# Patient Record
Sex: Female | Born: 1960 | Race: White | Hispanic: No | Marital: Married | Smoking: Never smoker
Health system: Southern US, Community
[De-identification: ages and names within clinical notes are randomized; demographics above are authoritative.]

## PROBLEM LIST (undated history)

## (undated) DIAGNOSIS — R002 Palpitations: Secondary | ICD-10-CM

## (undated) DIAGNOSIS — E209 Hypoparathyroidism, unspecified: Secondary | ICD-10-CM

## (undated) DIAGNOSIS — E785 Hyperlipidemia, unspecified: Secondary | ICD-10-CM

## (undated) DIAGNOSIS — C73 Malignant neoplasm of thyroid gland: Secondary | ICD-10-CM

## (undated) DIAGNOSIS — E042 Nontoxic multinodular goiter: Secondary | ICD-10-CM

## (undated) HISTORY — DX: Palpitations: R00.2

## (undated) HISTORY — PX: THYROID SURGERY: SHX805

## (undated) HISTORY — DX: Hyperlipidemia, unspecified: E78.5

## (undated) HISTORY — DX: Nontoxic multinodular goiter: E04.2

## (undated) HISTORY — PX: OTHER SURGICAL HISTORY: SHX169

## (undated) HISTORY — DX: Malignant neoplasm of thyroid gland: C73

## (undated) HISTORY — DX: Hypoparathyroidism, unspecified: E20.9

---

## 2000-10-21 ENCOUNTER — Other Ambulatory Visit: Admission: RE | Admit: 2000-10-21 | Discharge: 2000-10-21 | Payer: Self-pay | Admitting: *Deleted

## 2001-10-28 ENCOUNTER — Other Ambulatory Visit: Admission: RE | Admit: 2001-10-28 | Discharge: 2001-10-28 | Payer: Self-pay | Admitting: *Deleted

## 2002-12-13 ENCOUNTER — Other Ambulatory Visit: Admission: RE | Admit: 2002-12-13 | Discharge: 2002-12-13 | Payer: Self-pay | Admitting: *Deleted

## 2004-01-23 ENCOUNTER — Other Ambulatory Visit: Admission: RE | Admit: 2004-01-23 | Discharge: 2004-01-23 | Payer: Self-pay | Admitting: *Deleted

## 2005-02-05 ENCOUNTER — Other Ambulatory Visit: Admission: RE | Admit: 2005-02-05 | Discharge: 2005-02-05 | Payer: Self-pay | Admitting: *Deleted

## 2006-02-11 ENCOUNTER — Other Ambulatory Visit: Admission: RE | Admit: 2006-02-11 | Discharge: 2006-02-11 | Payer: Self-pay | Admitting: *Deleted

## 2006-04-07 ENCOUNTER — Encounter: Admission: RE | Admit: 2006-04-07 | Discharge: 2006-04-07 | Payer: Self-pay | Admitting: Surgery

## 2006-04-15 ENCOUNTER — Encounter: Admission: RE | Admit: 2006-04-15 | Discharge: 2006-04-15 | Payer: Self-pay | Admitting: Surgery

## 2006-10-27 ENCOUNTER — Encounter: Admission: RE | Admit: 2006-10-27 | Discharge: 2006-10-27 | Payer: Self-pay | Admitting: Surgery

## 2007-03-09 ENCOUNTER — Other Ambulatory Visit: Admission: RE | Admit: 2007-03-09 | Discharge: 2007-03-09 | Payer: Self-pay | Admitting: Family Medicine

## 2007-06-24 ENCOUNTER — Encounter: Admission: RE | Admit: 2007-06-24 | Discharge: 2007-06-24 | Payer: Self-pay | Admitting: Family Medicine

## 2008-03-09 ENCOUNTER — Other Ambulatory Visit: Admission: RE | Admit: 2008-03-09 | Discharge: 2008-03-09 | Payer: Self-pay | Admitting: Family Medicine

## 2008-07-04 ENCOUNTER — Encounter: Admission: RE | Admit: 2008-07-04 | Discharge: 2008-07-04 | Payer: Self-pay | Admitting: Family Medicine

## 2009-01-16 ENCOUNTER — Other Ambulatory Visit: Admission: RE | Admit: 2009-01-16 | Discharge: 2009-01-16 | Payer: Self-pay | Admitting: Interventional Radiology

## 2009-01-16 ENCOUNTER — Encounter: Admission: RE | Admit: 2009-01-16 | Discharge: 2009-01-16 | Payer: Self-pay | Admitting: Surgery

## 2009-03-08 ENCOUNTER — Ambulatory Visit (HOSPITAL_COMMUNITY): Admission: RE | Admit: 2009-03-08 | Discharge: 2009-03-09 | Payer: Self-pay | Admitting: Surgery

## 2009-03-08 ENCOUNTER — Encounter (INDEPENDENT_AMBULATORY_CARE_PROVIDER_SITE_OTHER): Payer: Self-pay | Admitting: Surgery

## 2009-03-29 ENCOUNTER — Other Ambulatory Visit: Admission: RE | Admit: 2009-03-29 | Discharge: 2009-03-29 | Payer: Self-pay | Admitting: Family Medicine

## 2009-04-30 ENCOUNTER — Encounter (HOSPITAL_COMMUNITY): Admission: RE | Admit: 2009-04-30 | Discharge: 2009-07-25 | Payer: Self-pay | Admitting: Internal Medicine

## 2009-07-31 ENCOUNTER — Encounter: Admission: RE | Admit: 2009-07-31 | Discharge: 2009-07-31 | Payer: Self-pay | Admitting: Family Medicine

## 2009-09-18 ENCOUNTER — Encounter: Admission: RE | Admit: 2009-09-18 | Discharge: 2009-09-18 | Payer: Self-pay | Admitting: Internal Medicine

## 2009-09-25 ENCOUNTER — Encounter: Admission: RE | Admit: 2009-09-25 | Discharge: 2009-09-25 | Payer: Self-pay | Admitting: Internal Medicine

## 2009-09-25 ENCOUNTER — Other Ambulatory Visit: Admission: RE | Admit: 2009-09-25 | Discharge: 2009-09-25 | Payer: Self-pay | Admitting: Interventional Radiology

## 2010-03-17 ENCOUNTER — Encounter: Payer: Self-pay | Admitting: Internal Medicine

## 2010-03-17 ENCOUNTER — Encounter: Payer: Self-pay | Admitting: Family Medicine

## 2010-03-17 ENCOUNTER — Encounter: Payer: Self-pay | Admitting: Surgery

## 2010-03-18 ENCOUNTER — Encounter: Payer: Self-pay | Admitting: Surgery

## 2010-03-31 ENCOUNTER — Other Ambulatory Visit (HOSPITAL_COMMUNITY): Payer: Self-pay | Admitting: Internal Medicine

## 2010-03-31 DIAGNOSIS — C73 Malignant neoplasm of thyroid gland: Secondary | ICD-10-CM

## 2010-04-08 ENCOUNTER — Other Ambulatory Visit: Payer: Self-pay | Admitting: Physician Assistant

## 2010-04-08 ENCOUNTER — Other Ambulatory Visit (HOSPITAL_COMMUNITY)
Admission: RE | Admit: 2010-04-08 | Discharge: 2010-04-08 | Disposition: A | Discharge status: Home / Self Care | Payer: Managed Care, Other (non HMO) | Source: Ambulatory Visit | Attending: Family Medicine | Admitting: Family Medicine

## 2010-04-08 DIAGNOSIS — Z01419 Encounter for gynecological examination (general) (routine) without abnormal findings: Secondary | ICD-10-CM | POA: Insufficient documentation

## 2010-04-15 ENCOUNTER — Ambulatory Visit (HOSPITAL_COMMUNITY)
Admission: RE | Admit: 2010-04-15 | Discharge: 2010-04-15 | Disposition: A | Discharge status: Home / Self Care | Payer: Managed Care, Other (non HMO) | Source: Ambulatory Visit | Attending: Internal Medicine | Admitting: Internal Medicine

## 2010-04-15 DIAGNOSIS — C73 Malignant neoplasm of thyroid gland: Secondary | ICD-10-CM | POA: Insufficient documentation

## 2010-04-16 ENCOUNTER — Ambulatory Visit (HOSPITAL_COMMUNITY)
Admission: RE | Admit: 2010-04-16 | Discharge: 2010-04-16 | Disposition: A | Discharge status: Home / Self Care | Payer: Managed Care, Other (non HMO) | Source: Ambulatory Visit | Attending: Internal Medicine | Admitting: Internal Medicine

## 2010-04-17 ENCOUNTER — Ambulatory Visit (HOSPITAL_COMMUNITY)
Admission: RE | Admit: 2010-04-17 | Discharge: 2010-04-17 | Disposition: A | Discharge status: Home / Self Care | Payer: Managed Care, Other (non HMO) | Source: Ambulatory Visit | Attending: Internal Medicine | Admitting: Internal Medicine

## 2010-04-17 DIAGNOSIS — Z01419 Encounter for gynecological examination (general) (routine) without abnormal findings: Secondary | ICD-10-CM | POA: Insufficient documentation

## 2010-04-19 ENCOUNTER — Ambulatory Visit (HOSPITAL_COMMUNITY)
Admission: RE | Admit: 2010-04-19 | Discharge: 2010-04-19 | Disposition: A | Discharge status: Home / Self Care | Payer: Managed Care, Other (non HMO) | Source: Ambulatory Visit | Attending: Internal Medicine | Admitting: Internal Medicine

## 2010-04-19 DIAGNOSIS — C73 Malignant neoplasm of thyroid gland: Secondary | ICD-10-CM | POA: Insufficient documentation

## 2010-04-19 MED ORDER — SODIUM IODIDE I 131 CAPSULE: 4.0000 | Freq: Once | INTRAVENOUS | Status: AC | PRN

## 2010-04-22 LAB — THYROGLOBULIN ANTIBODY: Thyroglobulin Ab: <20 U/mL (ref <40.0)

## 2010-04-22 LAB — THYROGLOBULIN LEVEL: Thyroglobulin: <0.2 ng/mL (ref 0.0–55.0)

## 2010-05-12 LAB — BASIC METABOLIC PANEL
BUN: 11 mg/dL (ref 6–23)
Chloride: 105 mEq/L (ref 96–112)
Creatinine, Ser: 0.58 mg/dL (ref 0.4–1.2)
GFR calc Af Amer: >60 mL/min (ref >60)
Glucose, Bld: 88 mg/dL (ref 70–99)
Potassium: 4.2 mEq/L (ref 3.5–5.1)
Sodium: 139 mEq/L (ref 135–145)

## 2010-05-12 LAB — CBC
HCT: 37.4 % (ref 36.0–46.0)
MCV: 97 fL (ref 78.0–100.0)
Platelets: 303 10*3/uL (ref 150–400)
RBC: 3.86 MIL/uL — ABNORMAL LOW (ref 3.87–5.11)
WBC: 4.6 10*3/uL (ref 4.0–10.5)

## 2010-05-12 LAB — PROTIME-INR
INR: 0.96 (ref 0.00–1.49)
Prothrombin Time: 12.7 seconds (ref 11.6–15.2)

## 2010-05-12 LAB — URINE MICROSCOPIC-ADD ON

## 2010-05-12 LAB — DIFFERENTIAL
Eosinophils Absolute: 0 10*3/uL (ref 0.0–0.7)
Lymphocytes Relative: 32 % (ref 12–46)
Lymphs Abs: 1.5 10*3/uL (ref 0.7–4.0)
Monocytes Relative: 10 % (ref 3–12)
Neutrophils Relative %: 56 % (ref 43–77)

## 2010-05-12 LAB — URINALYSIS, ROUTINE W REFLEX MICROSCOPIC
Leukocytes, UA: NEGATIVE
Nitrite: NEGATIVE
Specific Gravity, Urine: 1.025 (ref 1.005–1.030)
Urobilinogen, UA: 0.2 mg/dL (ref 0.0–1.0)
pH: 6 (ref 5.0–8.0)

## 2010-05-12 LAB — CALCIUM: Calcium: 8.1 mg/dL — ABNORMAL LOW (ref 8.4–10.5)

## 2010-06-27 ENCOUNTER — Other Ambulatory Visit: Payer: Self-pay | Admitting: Advanced Practice Midwife

## 2010-06-27 DIAGNOSIS — Z1231 Encounter for screening mammogram for malignant neoplasm of breast: Secondary | ICD-10-CM

## 2010-07-01 ENCOUNTER — Encounter (INDEPENDENT_AMBULATORY_CARE_PROVIDER_SITE_OTHER): Payer: Self-pay | Admitting: Surgery

## 2010-07-01 DIAGNOSIS — E079 Disorder of thyroid, unspecified: Secondary | ICD-10-CM | POA: Insufficient documentation

## 2010-07-01 DIAGNOSIS — D229 Melanocytic nevi, unspecified: Secondary | ICD-10-CM | POA: Insufficient documentation

## 2010-07-01 DIAGNOSIS — R490 Dysphonia: Secondary | ICD-10-CM | POA: Insufficient documentation

## 2010-08-05 ENCOUNTER — Ambulatory Visit
Admission: RE | Admit: 2010-08-05 | Discharge: 2010-08-05 | Disposition: A | Discharge status: Home / Self Care | Payer: Managed Care, Other (non HMO) | Source: Ambulatory Visit | Attending: Advanced Practice Midwife | Admitting: Advanced Practice Midwife

## 2010-08-05 DIAGNOSIS — Z1231 Encounter for screening mammogram for malignant neoplasm of breast: Secondary | ICD-10-CM

## 2011-03-16 IMAGING — US US BIOPSY
1 series · 13 of 16 positions shown · non-contrast
Comparison: None

CLINICAL DATA: Bilateral thyroid nodules being followed by general
surgery.  Request for fine needle aspiration of both nodules due to
increase in size and calcifications.

ULTRASOUND-GUIDED NEEDLE ASPIRATE BIOPSY,  LEFT AND RIGHT LOBE OF
THE THYROID
The above procedure was discussed with the patient and written
informed consent was obtained.

[Series 1: us biopsy · 0.05mm/px · 16 acquisitions, 13 frames shown]
[im 1/16]
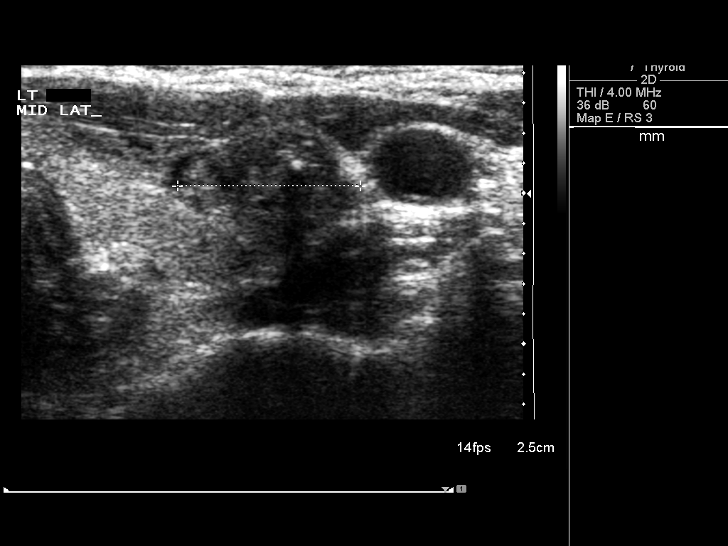
[im 2/16]
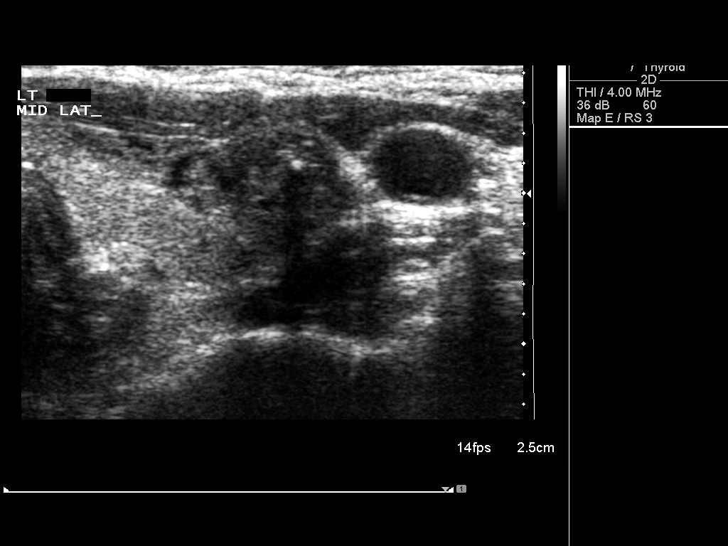
[im 4/16]
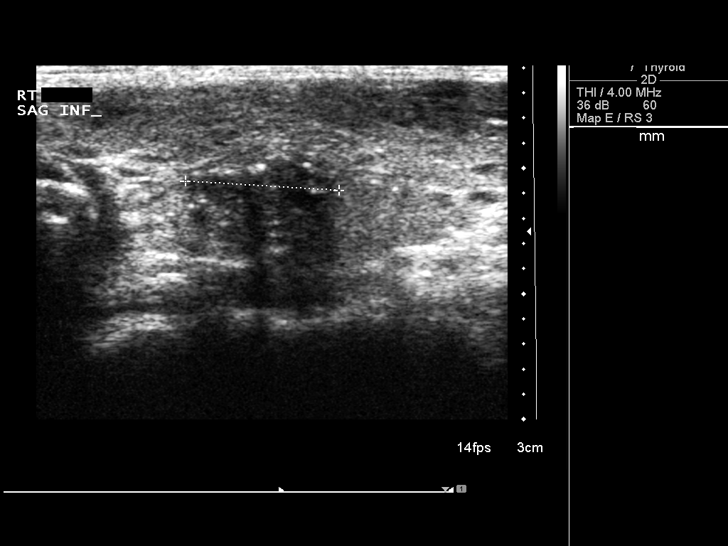
[im 5/16]
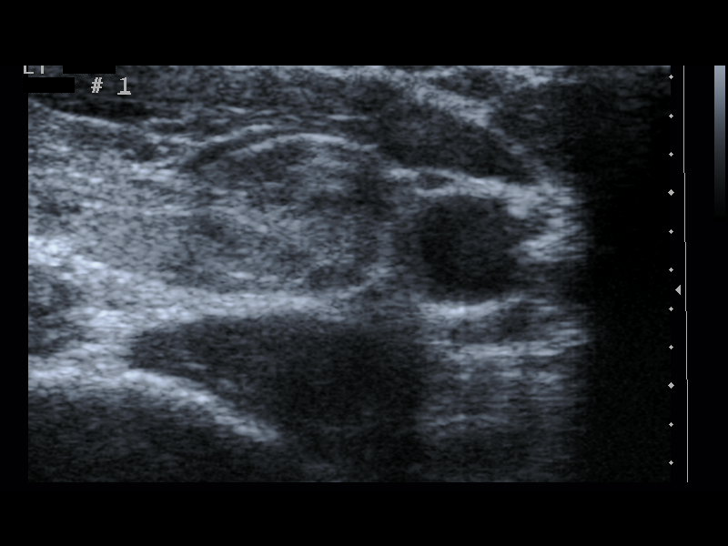
[im 6/16]
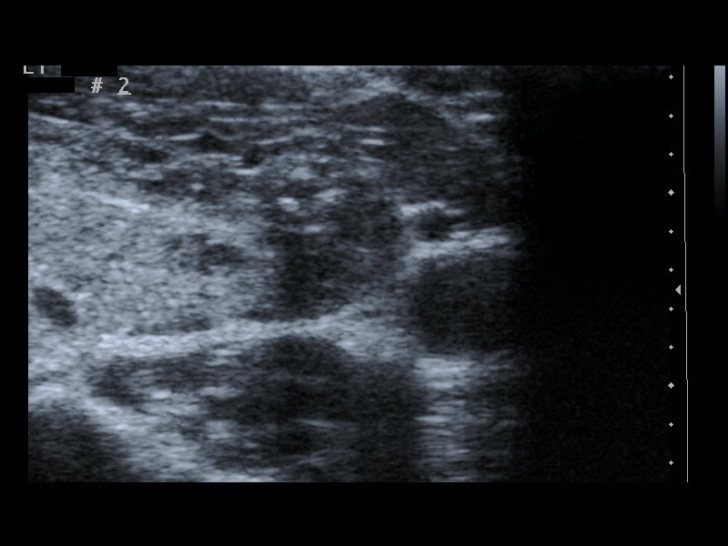
[im 7/16]
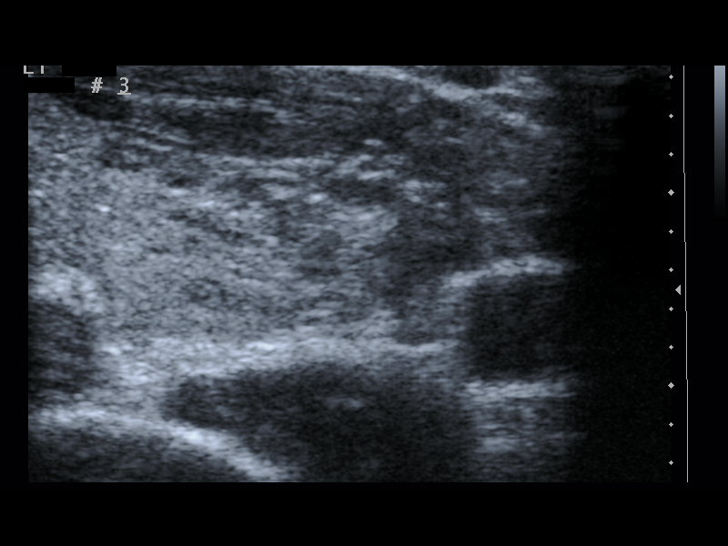
[im 9/16]
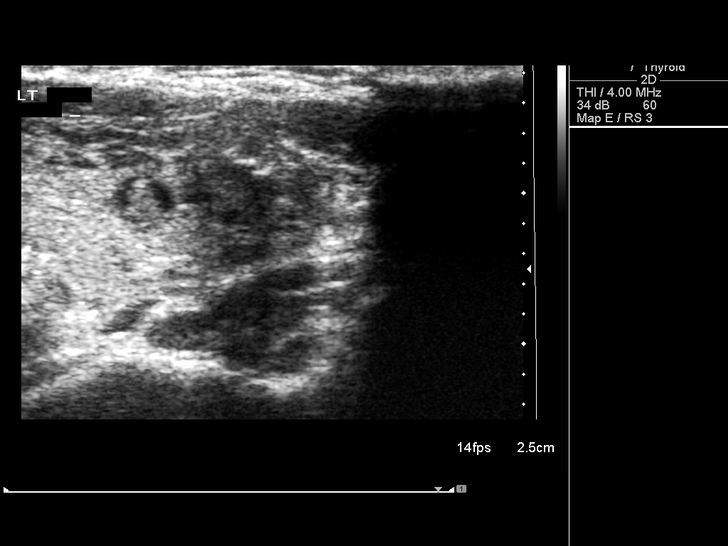
[im 10/16]
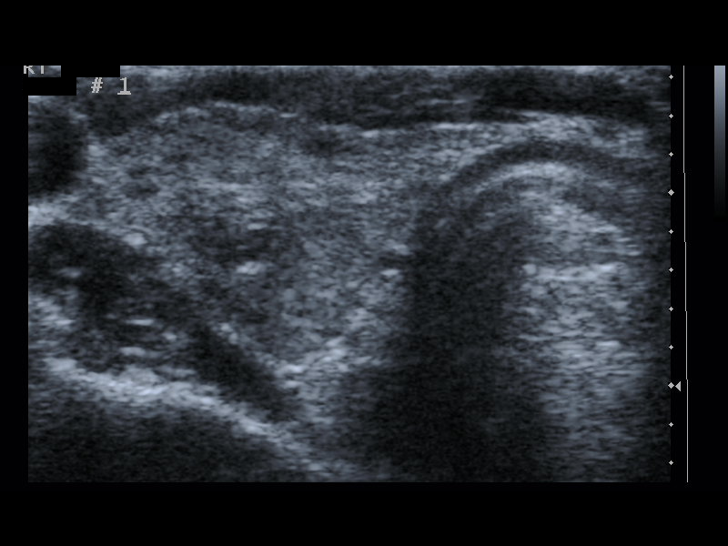
[im 11/16]
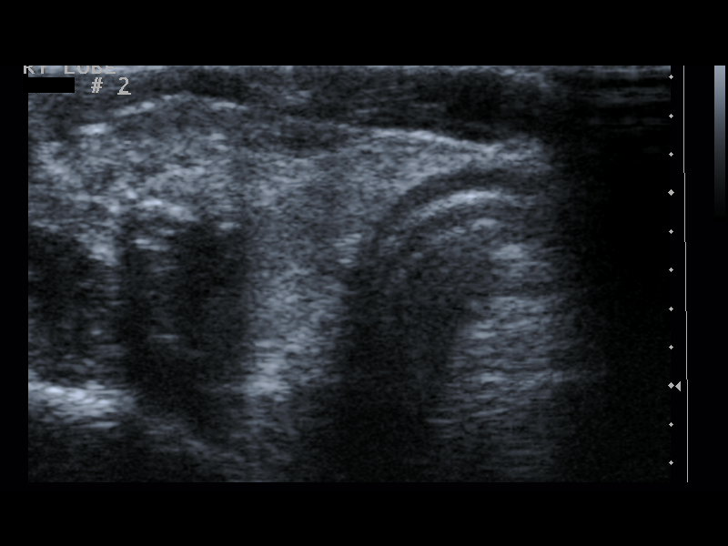
[im 12/16]
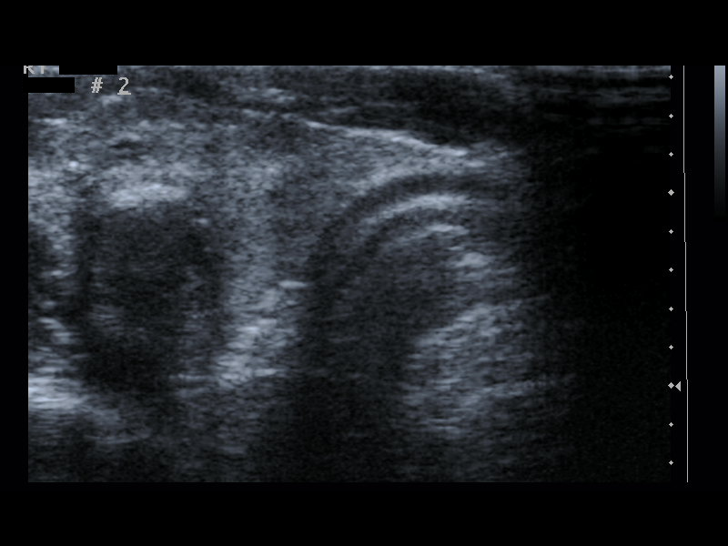
[im 13/16]
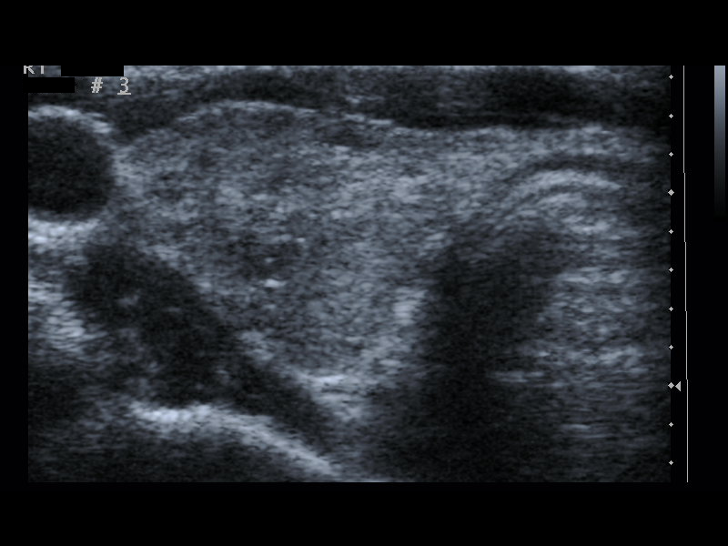
[im 15/16]
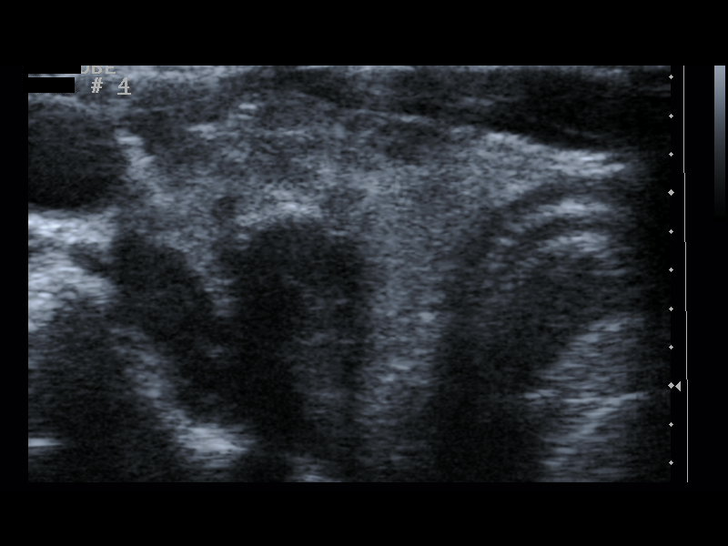
[im 16/16]
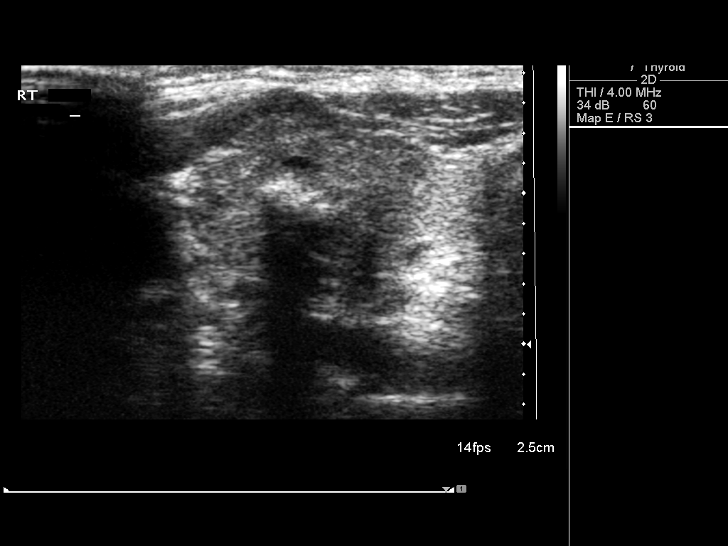

[13 of 16 positions shown; findings below may reference images not displayed]

FINDINGS: Ultrasound was performed to localize and mark an adequate
site for the biopsy.  The patient was then prepped and draped in a
normal sterile fashion.  Local anesthesia was provided with 1%
lidocaine.  Using direct ultrasound guidance, three passes were
made using 25 gauge needles into the nodule within the left lobe of
the thyroid.  Ultrasound was used to confirm needle placements on
all occasions.

Local anesthesia was provided with 1% lidocaine over the right
thyroid gland.  Using direct ultrasound guidance, four passes were
made using 25 gauge needles into the nodule within the right lobe
of the thyroid.  Ultrasound was used to confirm needle placement on
all occasions.

Specimens were sent to Pathology for analysis.  Post procedural
imaging demonstrated no hematoma or immediate complication.  The
patient tolerated the procedure well.
IMPRESSION: Successful fine needle aspirate of right and left thyroid nodules
as described above.

Read by: Brousset, Gilberto Pedro.-MANCE

## 2011-05-05 ENCOUNTER — Encounter (INDEPENDENT_AMBULATORY_CARE_PROVIDER_SITE_OTHER): Payer: Self-pay | Admitting: Surgery

## 2011-05-05 ENCOUNTER — Ambulatory Visit (INDEPENDENT_AMBULATORY_CARE_PROVIDER_SITE_OTHER): Payer: Managed Care, Other (non HMO) | Admitting: Surgery

## 2011-05-05 VITALS — BP 112/72 | HR 80 | Temp 97.0°F | Resp 16 | Ht 66.0 in | Wt 124.0 lb

## 2011-05-05 DIAGNOSIS — Z85850 Personal history of malignant neoplasm of thyroid: Secondary | ICD-10-CM | POA: Insufficient documentation

## 2011-05-05 NOTE — Progress Notes (Signed)
Visit Diagnoses: 1. Hx of thyroid cancer     HISTORY: Patient is a 51 year old white female well-known to my surgical practice. She is now over 2 years out from total thyroidectomy with limited lymph node dissection for multifocal papillary thyroid carcinoma. She was treated with radioactive iodine ablation. She returns today for her final postoperative visit. She continues to be followed closely by her endocrinologist.  Recent laboratory studies in January 2013 show a thyroglobulin level which is undetectable. Patient is taking Synthroid 175 mcg daily and had a TSH level of 0.19. Calcium level was normal at 8.6.  PERTINENT REVIEW OF SYSTEMS: Denies masses. Denies tenderness. Voice quality normal except when fatigued.  No tremors. No palpitations.  EXAM: HEENT: normocephalic; pupils equal and reactive; sclerae clear; dentition good; mucous membranes moist NECK:  On palpation no nodular densities in the thyroid bed or around the airway. No tenderness;  Incision with good cosmetic results; symmetric on extension; no palpable anterior or posterior cervical lymphadenopathy; no supraclavicular masses; no tenderness CHEST: clear to auscultation bilaterally without rales, rhonchi, or wheezes CARDIAC: regular rate and rhythm without significant murmur; peripheral pulses are full EXT:  non-tender without edema; no deformity NEURO: no gross focal deficits; no sign of tremor   IMPRESSION: Personal history of papillary thyroid carcinoma, no evidence of recurrent disease  PLAN: The patient will continue followup with Dr. Talmage Coin from endocrinology. No further studies are required at this time. Patient will return for surgical followup as needed.  Velora Heckler, MD, FACS General & Endocrine Surgery [facility]

## 2011-07-03 ENCOUNTER — Other Ambulatory Visit: Payer: Self-pay | Admitting: Family Medicine

## 2011-07-03 DIAGNOSIS — Z1231 Encounter for screening mammogram for malignant neoplasm of breast: Secondary | ICD-10-CM

## 2011-08-11 ENCOUNTER — Ambulatory Visit
Admission: RE | Admit: 2011-08-11 | Discharge: 2011-08-11 | Disposition: A | Discharge status: Home / Self Care | Payer: Managed Care, Other (non HMO) | Source: Ambulatory Visit | Attending: Family Medicine | Admitting: Family Medicine

## 2011-08-11 DIAGNOSIS — Z1231 Encounter for screening mammogram for malignant neoplasm of breast: Secondary | ICD-10-CM

## 2011-09-30 ENCOUNTER — Other Ambulatory Visit: Payer: Self-pay | Admitting: Internal Medicine

## 2011-09-30 DIAGNOSIS — C73 Malignant neoplasm of thyroid gland: Secondary | ICD-10-CM

## 2011-10-13 ENCOUNTER — Other Ambulatory Visit: Payer: Managed Care, Other (non HMO)

## 2011-10-14 ENCOUNTER — Ambulatory Visit
Admission: RE | Admit: 2011-10-14 | Discharge: 2011-10-14 | Disposition: A | Discharge status: Home / Self Care | Payer: Managed Care, Other (non HMO) | Source: Ambulatory Visit | Attending: Internal Medicine | Admitting: Internal Medicine

## 2011-10-14 DIAGNOSIS — C73 Malignant neoplasm of thyroid gland: Secondary | ICD-10-CM

## 2012-07-05 ENCOUNTER — Other Ambulatory Visit: Payer: Self-pay

## 2012-07-05 DIAGNOSIS — Z1231 Encounter for screening mammogram for malignant neoplasm of breast: Secondary | ICD-10-CM

## 2012-07-09 MED ORDER — HYDROMORPHONE HCL PF 1 MG/ML IJ SOLN
INTRAMUSCULAR | Status: AC
  Filled 2012-07-09: qty 1

## 2012-07-14 ENCOUNTER — Encounter (HOSPITAL_COMMUNITY): Payer: Self-pay | Admitting: Pharmacist

## 2012-07-20 ENCOUNTER — Other Ambulatory Visit: Payer: Self-pay | Admitting: Obstetrics and Gynecology

## 2012-07-22 ENCOUNTER — Encounter (HOSPITAL_COMMUNITY)
Admission: RE | Admit: 2012-07-22 | Discharge: 2012-07-22 | Disposition: A | Discharge status: Home / Self Care | Payer: BC Managed Care – PPO | Source: Ambulatory Visit | Attending: Obstetrics and Gynecology | Admitting: Obstetrics and Gynecology

## 2012-07-22 ENCOUNTER — Encounter (HOSPITAL_COMMUNITY): Payer: Self-pay

## 2012-07-22 DIAGNOSIS — Z01818 Encounter for other preprocedural examination: Secondary | ICD-10-CM | POA: Insufficient documentation

## 2012-07-22 DIAGNOSIS — Z01812 Encounter for preprocedural laboratory examination: Secondary | ICD-10-CM | POA: Insufficient documentation

## 2012-07-22 LAB — CBC
HCT: 36.4 % (ref 36.0–46.0)
MCH: 32.3 pg (ref 26.0–34.0)
MCHC: 33.2 g/dL (ref 30.0–36.0)
MCV: 97.1 fL (ref 78.0–100.0)
Platelets: 290 10*3/uL (ref 150–400)
RDW: 13 % (ref 11.5–15.5)
WBC: 6.8 10*3/uL (ref 4.0–10.5)

## 2012-07-22 LAB — BASIC METABOLIC PANEL
BUN: 13 mg/dL (ref 6–23)
CO2: 25 mEq/L (ref 19–32)
Calcium: 9.1 mg/dL (ref 8.4–10.5)
Creatinine, Ser: 0.59 mg/dL (ref 0.50–1.10)
Glucose, Bld: 137 mg/dL — ABNORMAL HIGH (ref 70–99)

## 2012-07-22 NOTE — Patient Instructions (Addendum)
20 Pam Montgomery  07/22/2012   Your procedure is scheduled on:  07/27/12  Enter through the Main Entrance of [facility] at 6 AM.  Pick up the phone at the desk and dial [phone].   Call this number if you have problems the morning of surgery: [phone]   Remember:   Do not eat food:After Midnight.  Do not drink clear liquids: After Midnight.  Take these medicines the morning of surgery with A SIP OF WATER: synthroid   Do not wear jewelry, make-up or nail polish.  Do not wear lotions, powders, or perfumes. You may wear deodorant.  Do not shave 48 hours prior to surgery.  Do not bring valuables to the hospital.  [facility] is not responsible                  for any belongings or valuables brought to the hospital.  Contacts, dentures or bridgework may not be worn into surgery.  Leave suitcase in the car. After surgery it may be brought to your room.  For patients admitted to the hospital, checkout time is 11:00 AM the day of                discharge.   Patients discharged the day of surgery will not be allowed to drive                   home.  Name and phone number of your driver: NA  Special Instructions: Shower using CHG 2 nights before surgery and the night before surgery.  If you shower the day of surgery use CHG.  Use special wash - you have one bottle of CHG for all showers.  You should use approximately 1/3 of the bottle for each shower.   Please read over the following fact sheets that you were given: MRSA Information

## 2012-07-26 ENCOUNTER — Encounter (HOSPITAL_COMMUNITY): Payer: Self-pay | Admitting: Anesthesiology

## 2012-07-26 NOTE — Anesthesia Preprocedure Evaluation (Addendum)
Anesthesia Evaluation  Patient identified by MRN, date of birth, ID band Patient awake    Reviewed: Allergy & Precautions, H&P , NPO status , Patient's Chart, lab work & pertinent test results  Airway Mallampati: II      Dental no notable dental hx. (+) Teeth Intact   Pulmonary  Has some hoarseness to her voice since total thyroidectomy. breath sounds clear to auscultation  Pulmonary exam normal       Cardiovascular negative cardio ROS  Rhythm:Regular Rate:Normal     Neuro/Psych  Headaches, negative psych ROS   GI/Hepatic negative GI ROS, Neg liver ROS,   Endo/Other  Hypothyroidism Hyperlipidemia Hypoparathyroidism Vitamin D Deficiency Hx/o Thyroid Ca  Renal/GU negative Renal ROS  negative genitourinary   Musculoskeletal negative musculoskeletal ROS (+)   Abdominal   Peds  Hematology negative hematology ROS (+)   Anesthesia Other Findings   Reproductive/Obstetrics Uterine Leiomyoma                          Anesthesia Physical Anesthesia Plan  ASA: II  Anesthesia Plan: General   Post-op Pain Management:    Induction: Intravenous  Airway Management Planned: Oral ETT  Additional Equipment:   Intra-op Plan:   Post-operative Plan: Extubation in OR  Informed Consent: I have reviewed the patients History and Physical, chart, labs and discussed the procedure including the risks, benefits and alternatives for the proposed anesthesia with the patient or authorized representative who has indicated his/her understanding and acceptance.   Dental advisory given  Plan Discussed with: CRNA, Anesthesiologist and Surgeon  Anesthesia Plan Comments:         Anesthesia Quick Evaluation

## 2012-07-27 ENCOUNTER — Ambulatory Visit (HOSPITAL_COMMUNITY)
Admission: RE | Admit: 2012-07-27 | Discharge: 2012-07-28 | Disposition: A | Discharge status: Home / Self Care | Payer: BC Managed Care – PPO | Source: Ambulatory Visit | Attending: Obstetrics and Gynecology | Admitting: Obstetrics and Gynecology

## 2012-07-27 ENCOUNTER — Ambulatory Visit (HOSPITAL_COMMUNITY): Payer: BC Managed Care – PPO | Admitting: Anesthesiology

## 2012-07-27 ENCOUNTER — Encounter (HOSPITAL_COMMUNITY)
Admission: RE | Disposition: A | Discharge status: Home / Self Care | Payer: Self-pay | Source: Ambulatory Visit | Attending: Obstetrics and Gynecology

## 2012-07-27 ENCOUNTER — Encounter (HOSPITAL_COMMUNITY): Payer: Self-pay | Admitting: *Deleted

## 2012-07-27 ENCOUNTER — Encounter (HOSPITAL_COMMUNITY): Payer: Self-pay | Admitting: Anesthesiology

## 2012-07-27 DIAGNOSIS — D252 Subserosal leiomyoma of uterus: Secondary | ICD-10-CM | POA: Insufficient documentation

## 2012-07-27 DIAGNOSIS — D251 Intramural leiomyoma of uterus: Secondary | ICD-10-CM | POA: Insufficient documentation

## 2012-07-27 DIAGNOSIS — Z90710 Acquired absence of both cervix and uterus: Secondary | ICD-10-CM

## 2012-07-27 DIAGNOSIS — D279 Benign neoplasm of unspecified ovary: Secondary | ICD-10-CM | POA: Insufficient documentation

## 2012-07-27 HISTORY — PX: BILATERAL SALPINGECTOMY: SHX5743

## 2012-07-27 HISTORY — PX: CYSTOSCOPY: SHX5120

## 2012-07-27 HISTORY — PX: ROBOTIC ASSISTED TOTAL HYSTERECTOMY: SHX6085

## 2012-07-27 SURGERY — ROBOTIC ASSISTED TOTAL HYSTERECTOMY
Anesthesia: General | Site: Urethra | Wound class: Clean Contaminated

## 2012-07-27 MED ORDER — MEPERIDINE HCL 25 MG/ML IJ SOLN: 6.2500 mg | INTRAMUSCULAR | Status: DC | PRN

## 2012-07-27 MED ORDER — STERILE WATER FOR IRRIGATION IR SOLN
Status: DC | PRN
  Administered 2012-07-27: 10:00:00 via INTRAVESICAL

## 2012-07-27 MED ORDER — FENTANYL CITRATE 0.05 MG/ML IJ SOLN
INTRAMUSCULAR | Status: AC
  Filled 2012-07-27: qty 5

## 2012-07-27 MED ORDER — ROCURONIUM BROMIDE 100 MG/10ML IV SOLN
INTRAVENOUS | Status: DC | PRN
  Administered 2012-07-27: 20 mg via INTRAVENOUS
  Administered 2012-07-27: 10 mg via INTRAVENOUS
  Administered 2012-07-27: 20 mg via INTRAVENOUS
  Administered 2012-07-27: 15 mg via INTRAVENOUS
  Administered 2012-07-27: 50 mg via INTRAVENOUS
  Administered 2012-07-27: 10 mg via INTRAVENOUS

## 2012-07-27 MED ORDER — GLYCOPYRROLATE 0.2 MG/ML IJ SOLN
INTRAMUSCULAR | Status: AC
  Filled 2012-07-27: qty 2

## 2012-07-27 MED ORDER — KETOROLAC TROMETHAMINE 30 MG/ML IJ SOLN
INTRAMUSCULAR | Status: DC | PRN
  Administered 2012-07-27: 30 mg via INTRAVENOUS

## 2012-07-27 MED ORDER — HYDROMORPHONE HCL PF 1 MG/ML IJ SOLN
0.2000 mg | INTRAMUSCULAR | Status: DC | PRN
  Administered 2012-07-27: 0.6 mg via INTRAVENOUS
  Filled 2012-07-27: qty 1

## 2012-07-27 MED ORDER — INDIGOTINDISULFONATE SODIUM 8 MG/ML IJ SOLN
INTRAMUSCULAR | Status: AC
  Filled 2012-07-27: qty 5

## 2012-07-27 MED ORDER — LIDOCAINE HCL (CARDIAC) 20 MG/ML IV SOLN
INTRAVENOUS | Status: DC | PRN
  Administered 2012-07-27: 70 mg via INTRAVENOUS

## 2012-07-27 MED ORDER — GLYCOPYRROLATE 0.2 MG/ML IJ SOLN
INTRAMUSCULAR | Status: AC
  Filled 2012-07-27: qty 1

## 2012-07-27 MED ORDER — INDIGOTINDISULFONATE SODIUM 8 MG/ML IJ SOLN
INTRAMUSCULAR | Status: DC | PRN
  Administered 2012-07-27: 4 mL via INTRAVENOUS

## 2012-07-27 MED ORDER — FENTANYL CITRATE 0.05 MG/ML IJ SOLN: 25.0000 ug | INTRAMUSCULAR | Status: DC | PRN

## 2012-07-27 MED ORDER — NEOSTIGMINE METHYLSULFATE 1 MG/ML IJ SOLN
INTRAMUSCULAR | Status: AC
  Filled 2012-07-27: qty 1

## 2012-07-27 MED ORDER — INDIGOTINDISULFONATE SODIUM 8 MG/ML IJ SOLN
INTRAMUSCULAR | Status: DC | PRN
  Administered 2012-07-27: 5 mL via INTRAVENOUS

## 2012-07-27 MED ORDER — DEXAMETHASONE SODIUM PHOSPHATE 4 MG/ML IJ SOLN
INTRAMUSCULAR | Status: DC | PRN
  Administered 2012-07-27: 5 mg via INTRAVENOUS

## 2012-07-27 MED ORDER — CEFAZOLIN SODIUM-DEXTROSE 2-3 GM-% IV SOLR
2.0000 g | INTRAVENOUS | Status: AC
  Administered 2012-07-27: 2 g via INTRAVENOUS

## 2012-07-27 MED ORDER — ONDANSETRON HCL 4 MG PO TABS: 4.0000 mg | ORAL_TABLET | Freq: Four times a day (QID) | ORAL | Status: DC | PRN

## 2012-07-27 MED ORDER — METHYLENE BLUE 1 % INJ SOLN
INTRAMUSCULAR | Status: AC
  Filled 2012-07-27: qty 1

## 2012-07-27 MED ORDER — LEVOTHYROXINE SODIUM 175 MCG PO TABS
175.0000 ug | ORAL_TABLET | Freq: Every day | ORAL | Status: DC
  Administered 2012-07-28: 175 ug via ORAL
  Filled 2012-07-27 (×2): qty 1

## 2012-07-27 MED ORDER — DEXAMETHASONE SODIUM PHOSPHATE 10 MG/ML IJ SOLN
INTRAMUSCULAR | Status: AC
  Filled 2012-07-27: qty 1

## 2012-07-27 MED ORDER — ONDANSETRON HCL 4 MG/2ML IJ SOLN: 4.0000 mg | Freq: Four times a day (QID) | INTRAMUSCULAR | Status: DC | PRN

## 2012-07-27 MED ORDER — MUPIROCIN 2 % EX OINT
TOPICAL_OINTMENT | Freq: Two times a day (BID) | CUTANEOUS | Status: DC
  Administered 2012-07-27: 22:00:00 via NASAL
  Filled 2012-07-27: qty 22

## 2012-07-27 MED ORDER — LIDOCAINE HCL (CARDIAC) 20 MG/ML IV SOLN
INTRAVENOUS | Status: AC
  Filled 2012-07-27: qty 5

## 2012-07-27 MED ORDER — PROPOFOL 10 MG/ML IV BOLUS
INTRAVENOUS | Status: DC | PRN
Start: 2012-07-27 — End: 2012-07-27
  Administered 2012-07-27: 50 mg via INTRAVENOUS
  Administered 2012-07-27: 170 mg via INTRAVENOUS

## 2012-07-27 MED ORDER — MIDAZOLAM HCL 5 MG/5ML IJ SOLN
INTRAMUSCULAR | Status: DC | PRN
  Administered 2012-07-27: 2 mg via INTRAVENOUS

## 2012-07-27 MED ORDER — KETOROLAC TROMETHAMINE 30 MG/ML IJ SOLN
30.0000 mg | Freq: Four times a day (QID) | INTRAMUSCULAR | Status: DC
  Administered 2012-07-27 – 2012-07-28 (×3): 30 mg via INTRAVENOUS
  Filled 2012-07-27 (×3): qty 1

## 2012-07-27 MED ORDER — STERILE WATER FOR IRRIGATION IR SOLN
Status: DC | PRN
  Administered 2012-07-27: 1000 mL via INTRAVESICAL

## 2012-07-27 MED ORDER — ROCURONIUM BROMIDE 50 MG/5ML IV SOLN
INTRAVENOUS | Status: AC
  Filled 2012-07-27: qty 1

## 2012-07-27 MED ORDER — CEFAZOLIN SODIUM-DEXTROSE 2-3 GM-% IV SOLR
INTRAVENOUS | Status: AC
  Filled 2012-07-27: qty 50

## 2012-07-27 MED ORDER — ONDANSETRON HCL 4 MG/2ML IJ SOLN
INTRAMUSCULAR | Status: AC
  Filled 2012-07-27: qty 2

## 2012-07-27 MED ORDER — ARTIFICIAL TEARS OP OINT
TOPICAL_OINTMENT | OPHTHALMIC | Status: AC
  Filled 2012-07-27: qty 3.5

## 2012-07-27 MED ORDER — ROPIVACAINE HCL 5 MG/ML IJ SOLN
INTRAMUSCULAR | Status: AC
  Filled 2012-07-27: qty 60

## 2012-07-27 MED ORDER — KETOROLAC TROMETHAMINE 30 MG/ML IJ SOLN: 30.0000 mg | Freq: Four times a day (QID) | INTRAMUSCULAR | Status: DC

## 2012-07-27 MED ORDER — PROPOFOL 10 MG/ML IV EMUL
INTRAVENOUS | Status: AC
  Filled 2012-07-27: qty 20

## 2012-07-27 MED ORDER — GLYCOPYRROLATE 0.2 MG/ML IJ SOLN
INTRAMUSCULAR | Status: DC | PRN
  Administered 2012-07-27: .6 mg via INTRAVENOUS

## 2012-07-27 MED ORDER — ONDANSETRON HCL 4 MG/2ML IJ SOLN
INTRAMUSCULAR | Status: DC | PRN
  Administered 2012-07-27: 4 mg via INTRAVENOUS

## 2012-07-27 MED ORDER — LACTATED RINGERS IV SOLN
INTRAVENOUS | Status: DC
  Administered 2012-07-27 (×4): via INTRAVENOUS

## 2012-07-27 MED ORDER — SCOPOLAMINE 1 MG/3DAYS TD PT72
MEDICATED_PATCH | TRANSDERMAL | Status: AC
  Administered 2012-07-27: 1.5 mg via TRANSDERMAL
  Filled 2012-07-27: qty 1

## 2012-07-27 MED ORDER — CHLORHEXIDINE GLUCONATE CLOTH 2 % EX PADS: 6.0000 | MEDICATED_PAD | Freq: Every day | CUTANEOUS | Status: DC

## 2012-07-27 MED ORDER — SCOPOLAMINE 1 MG/3DAYS TD PT72: 1.0000 | MEDICATED_PATCH | TRANSDERMAL | Status: DC

## 2012-07-27 MED ORDER — NEOSTIGMINE METHYLSULFATE 1 MG/ML IJ SOLN
INTRAMUSCULAR | Status: DC | PRN
  Administered 2012-07-27: 4 mg via INTRAVENOUS

## 2012-07-27 MED ORDER — ACETAMINOPHEN 10 MG/ML IV SOLN
INTRAVENOUS | Status: DC | PRN
  Administered 2012-07-27: 1000 mg via INTRAVENOUS

## 2012-07-27 MED ORDER — FENTANYL CITRATE 0.05 MG/ML IJ SOLN
INTRAMUSCULAR | Status: DC | PRN
  Administered 2012-07-27: 50 ug via INTRAVENOUS
  Administered 2012-07-27 (×2): 100 ug via INTRAVENOUS

## 2012-07-27 MED ORDER — PHENYLEPHRINE 40 MCG/ML (10ML) SYRINGE FOR IV PUSH (FOR BLOOD PRESSURE SUPPORT)
PREFILLED_SYRINGE | INTRAVENOUS | Status: AC
  Filled 2012-07-27: qty 5

## 2012-07-27 MED ORDER — LACTATED RINGERS IV SOLN
INTRAVENOUS | Status: DC
  Administered 2012-07-27 – 2012-07-28 (×3): via INTRAVENOUS

## 2012-07-27 MED ORDER — PROPOFOL 10 MG/ML IV BOLUS: INTRAVENOUS | Status: DC | PRN

## 2012-07-27 MED ORDER — ACETAMINOPHEN 10 MG/ML IV SOLN
INTRAVENOUS | Status: AC
  Filled 2012-07-27: qty 100

## 2012-07-27 MED ORDER — LACTATED RINGERS IR SOLN
Status: DC | PRN
  Administered 2012-07-27: 3000 mL

## 2012-07-27 MED ORDER — KETOROLAC TROMETHAMINE 30 MG/ML IJ SOLN
INTRAMUSCULAR | Status: AC
  Filled 2012-07-27: qty 1

## 2012-07-27 MED ORDER — ROPIVACAINE HCL 5 MG/ML IJ SOLN
INTRAMUSCULAR | Status: DC | PRN
  Administered 2012-07-27: 110 mL

## 2012-07-27 MED ORDER — OXYCODONE-ACETAMINOPHEN 5-325 MG PO TABS
1.0000 | ORAL_TABLET | ORAL | Status: DC | PRN
  Administered 2012-07-27 – 2012-07-28 (×2): 1 via ORAL
  Filled 2012-07-27 (×2): qty 1

## 2012-07-27 MED ORDER — PHENYLEPHRINE HCL 10 MG/ML IJ SOLN
INTRAMUSCULAR | Status: DC | PRN
  Administered 2012-07-27: 40 mg via INTRAVENOUS
  Administered 2012-07-27: 120 mg via INTRAVENOUS

## 2012-07-27 MED ORDER — MIDAZOLAM HCL 2 MG/2ML IJ SOLN
INTRAMUSCULAR | Status: AC
  Filled 2012-07-27: qty 2

## 2012-07-27 MED ORDER — METOCLOPRAMIDE HCL 5 MG/ML IJ SOLN: 10.0000 mg | Freq: Once | INTRAMUSCULAR | Status: DC | PRN

## 2012-07-27 SURGICAL SUPPLY — 60 items
ADH SKN CLS APL DERMABOND .7 (GAUZE/BANDAGES/DRESSINGS) ×3
BAG URINE DRAINAGE (UROLOGICAL SUPPLIES) ×4 IMPLANT
BARRIER ADHS 3X4 INTERCEED (GAUZE/BANDAGES/DRESSINGS) ×4 IMPLANT
BRR ADH 4X3 ABS CNTRL BYND (GAUZE/BANDAGES/DRESSINGS) ×3
CATH FOLEY 3WAY  5CC 16FR (CATHETERS) ×1
CATH FOLEY 3WAY 5CC 16FR (CATHETERS) ×3 IMPLANT
CONT PATH 16OZ SNAP LID 3702 (MISCELLANEOUS) ×4 IMPLANT
COVER MAYO STAND STRL (DRAPES) ×4 IMPLANT
COVER TABLE BACK 60X90 (DRAPES) ×8 IMPLANT
COVER TIP SHEARS 8 DVNC (MISCELLANEOUS) ×3 IMPLANT
COVER TIP SHEARS 8MM DA VINCI (MISCELLANEOUS) ×1
DECANTER SPIKE VIAL GLASS SM (MISCELLANEOUS) ×10 IMPLANT
DERMABOND ADVANCED (GAUZE/BANDAGES/DRESSINGS) ×1
DERMABOND ADVANCED .7 DNX12 (GAUZE/BANDAGES/DRESSINGS) ×3 IMPLANT
DILATOR CANAL MILEX (MISCELLANEOUS) ×4 IMPLANT
DRAPE HUG U DISPOSABLE (DRAPE) ×4 IMPLANT
DRAPE LG THREE QUARTER DISP (DRAPES) ×8 IMPLANT
DRAPE WARM FLUID 44X44 (DRAPE) ×4 IMPLANT
ELECT REM PT RETURN 9FT ADLT (ELECTROSURGICAL) ×4
ELECTRODE REM PT RTRN 9FT ADLT (ELECTROSURGICAL) ×3 IMPLANT
EVACUATOR SMOKE 8.L (FILTER) ×4 IMPLANT
GAUZE VASELINE 3X9 (GAUZE/BANDAGES/DRESSINGS) IMPLANT
GLOVE BIO SURGEON STRL SZ7 (GLOVE) ×4 IMPLANT
GLOVE BIOGEL M 6.5 STRL (GLOVE) ×14 IMPLANT
GLOVE BIOGEL PI IND STRL 6.5 (GLOVE) ×3 IMPLANT
GLOVE BIOGEL PI IND STRL 7.0 (GLOVE) ×9 IMPLANT
GLOVE BIOGEL PI INDICATOR 6.5 (GLOVE) ×4
GLOVE BIOGEL PI INDICATOR 7.0 (GLOVE) ×5
GOWN STRL REIN XL XLG (GOWN DISPOSABLE) ×32 IMPLANT
KIT ACCESSORY DA VINCI DISP (KITS) ×1
KIT ACCESSORY DVNC DISP (KITS) ×3 IMPLANT
LEGGING LITHOTOMY PAIR STRL (DRAPES) ×4 IMPLANT
OCCLUDER COLPOPNEUMO (BALLOONS) ×2 IMPLANT
PACK LAVH (CUSTOM PROCEDURE TRAY) ×4 IMPLANT
PAD PREP 24X48 CUFFED NSTRL (MISCELLANEOUS) ×8 IMPLANT
PLUG CATH AND CAP STER (CATHETERS) ×4 IMPLANT
PROTECTOR NERVE ULNAR (MISCELLANEOUS) ×8 IMPLANT
SET CYSTO W/LG BORE CLAMP LF (SET/KITS/TRAYS/PACK) ×6 IMPLANT
SET IRRIG TUBING LAPAROSCOPIC (IRRIGATION / IRRIGATOR) ×4 IMPLANT
SOLUTION ELECTROLUBE (MISCELLANEOUS) ×4 IMPLANT
SUT VIC AB 0 CT1 27 (SUTURE) ×8
SUT VIC AB 0 CT1 27XBRD ANBCTR (SUTURE) ×6 IMPLANT
SUT VICRYL 0 27 CT2 27 ABS (SUTURE) ×23 IMPLANT
SUT VICRYL 0 UR6 27IN ABS (SUTURE) ×4 IMPLANT
SUT VICRYL RAPIDE 4/0 PS 2 (SUTURE) ×10 IMPLANT
SYR 50ML LL SCALE MARK (SYRINGE) ×8 IMPLANT
TIP RUMI ORANGE 6.7MMX12CM (TIP) IMPLANT
TIP UTERINE 5.1X6CM LAV DISP (MISCELLANEOUS) IMPLANT
TIP UTERINE 6.7X10CM GRN DISP (MISCELLANEOUS) ×2 IMPLANT
TIP UTERINE 6.7X6CM WHT DISP (MISCELLANEOUS) IMPLANT
TIP UTERINE 6.7X8CM BLUE DISP (MISCELLANEOUS) IMPLANT
TOWEL OR 17X24 6PK STRL BLUE (TOWEL DISPOSABLE) ×12 IMPLANT
TRAY FOLEY CATH 16FR SILVER (SET/KITS/TRAYS/PACK) ×2 IMPLANT
TROCAR 12M 150ML BLUNT (TROCAR) ×4 IMPLANT
TROCAR DISP BLADELESS 8 DVNC (TROCAR) ×3 IMPLANT
TROCAR DISP BLADELESS 8MM (TROCAR) ×1
TROCAR XCEL NON-BLD 11X100MML (ENDOMECHANICALS) ×4 IMPLANT
TUBING FILTER THERMOFLATOR (ELECTROSURGICAL) ×4 IMPLANT
WARMER LAPAROSCOPE (MISCELLANEOUS) ×4 IMPLANT
WATER STERILE IRR 1000ML POUR (IV SOLUTION) ×12 IMPLANT

## 2012-07-27 NOTE — Op Note (Signed)
07/27/2012  11:20 AM  PATIENT:  Pam Montgomery  52 y.o. female  PRE-OPERATIVE DIAGNOSIS:  Uterine Fibroid  POST-OPERATIVE DIAGNOSIS:  Uterine Fibroid  PROCEDURE:  Procedure(s) with comments: ROBOTIC ASSISTED TOTAL HYSTERECTOMY (N/A) - with vaginal morcellation BILATERAL SALPINGECTOMY (Bilateral) CYSTOSCOPY (N/A)  SURGEON:  Surgeon(s) and Role:    Dorien Chihuahua. Richardson Dopp, MD - Primary    * Geryl Rankins, MD - Assisting  PHYSICIAN ASSISTANT:   ASSISTANTS: Dr. Geryl Rankins    ANESTHESIA:   general  EBL: 100 ml   Total I/O In: 1900 [I.V.:1900] Out: 350 [Urine:250; Blood:100]  BLOOD ADMINISTERED:none  DRAINS: Urinary Catheter (Foley)   LOCAL MEDICATIONS USED:  OTHER ropivacaine   SPECIMEN:  Source of Specimen:  uterus cervix bilateral fallopian tubes and ovaries   DISPOSITION OF SPECIMEN:  PATHOLOGY  COUNTS:  YES  TOURNIQUET:  * No tourniquets in log *  DICTATION: .Dragon Dictation  PLAN OF CARE: Admit for overnight observation  PATIENT DISPOSITION:  PACU - hemodynamically stable.   Delay start of Pharmacological VTE agent (>24hrs) due to surgical blood loss or risk of bleeding: not applicable  Indication: This is a 52 year old  Large  uterine fibroids. She desires definitive therapy.   Procedure: The  patient was taken to the operating room where she was placed under general anesthesia. She was placed in dorsal lithotomy position and prepped and draped in the usual sterile fashion. A weighted speculum was placed into the vagina.  A Deaver was placed anteriorly for retraction. The anterior lip of the cervix was grasped with a single-tooth tenaculum. The vaginal mucosa was injected with 2.5 cc of ropivacaine at the 2/4/ 8 and 10 oclock  positions. The uterus was sounded to 10 cm the cervix was dilated to 6 mm . 0 vicryl suture  placed at the 12 and 6:00 positions  Of the cervix to facilitate placement of a Ru mi  uterine  manipulator. The manipulator was placed without  difficulty. Weighted speculum and Deaver were removed .  Attention was turned to the patient's abdomen where a  12 mm skin incision was made 4 cm above the umbilicus..  A 12 mm trocar was placed under direct visualization . The pneumoperitoneum  was achieved with PCO2 gas.  The laparoscope was removed. 60 cc of ropivacaine were injected into the abdominal cavity. The laparoscope  was reinserted. An  8mm incision was made in the right upper quadrant and an 8 mm   trocar was placed 12 centimeters from the umbilicus.later connected to robotic arm #1). An incision was made in there  left upper quadrant TROCAR WAS PLACED 16 cm from the umbilicus. Later connected to robotic arm #2.  Attention was turned to the right upper quadrant where a 11 mm midclavicular assistant  trocar was placed. ( All incision sites were injected with 10 cc of ropivacaine prior to port placement. )  Once all ports had been placed under direct visualization.The laparoscope was removed and the [employer] robotic system was then right-sided docked.  The robotic  arms were connected to the corresponding trocars as listed above. The laparoscope  was then reinserted.  The PK bipolar cautery was placed into port #1. The monopolar scissor placed in the port #2. All instruments were directed into the pelvis under direct visualization.  Attention  was turned to the surgeons console.. The Left ureter was identified.  The left  infundibulopelvic  ligament was cauterized with PK and excised with scissors. The broad ligament was  cauterized with PK incised with scissors. The round ligament was cauterized with the PK incised with scissors. The anterior leaf of broad ligament was incised along the bladder reflection to the midline.  The right ureter was identified. The right   infundibulopelvic  ligament was cauterized with PK and excised with scissors. The right broad ligament was cauterized with PK excised scissors. The right round ligament was  cauterized with PK and excised with scissors. The   broad ligament was incised  to the midline. The bladder was dissected off the lower uterine segments of the cervix via sharp and blunt dissection. The bladder was filled with sterile water and methylene blue.   The uterine arteries were skeletonized bilaterally. They were cauterized with PK and transected. The KOH ring  was identified.  The anterior colpotomy was performed followed by the posterior colpotomy. Once the uterus and cervix were completely excised They were removed through the vagina. Due to the size of there uterus the cervix and uterus were morcellated vaginally to facilitate removal of there specimen from the vagina.   The pk and scissors were removed and log tip forceps were   placed in the port #1 and the cutting needle driver was placed in to port #2. The vaginal cuff angles were closed with figure-of-eight stitches of 0 Vicryl. The remainder of the vaginal cuff was closed with interrupted 0 Vicryl figure-of-eight sutures. The pelvis was irrigated.  Marland Kitchen.Excellent hemostasis was noted. All pelvic pedicles were examined and hemostasis was noted. Inter seed was placed along the vaginal cuff. All instruments  removed from the ports.  All ports were removed under direct  Visualization. The  pneumoperitoneum was released.  The fascia of the 12 mm umbilical port was closed with 0 Vicryl and  the fascia the 11 mm port was closed with 0 Vicryl. The skin incisions were closed with 4-0 Vicryl and then covered with Derma bond.   Cystoscopy was performed using 30 degree cystoscope. Indigo carmine was expressed from both ureters.   Sponge lap and needle counts were correct x2.  The patient was awakened from anesthesia and taken to the recovery room in stable condition.

## 2012-07-27 NOTE — Anesthesia Postprocedure Evaluation (Signed)
  Anesthesia Post-op Note  Anesthesia Post Note  Patient: Pam Montgomery  Procedure(s) Performed: Procedure(s) (LRB): ROBOTIC ASSISTED TOTAL HYSTERECTOMY (N/A) BILATERAL SALPINGECTOMY (Bilateral) CYSTOSCOPY (N/A)  Anesthesia type: General  Patient location: PACU  Post pain: Pain level controlled  Post assessment: Post-op Vital signs reviewed  Last Vitals:  Filed Vitals:   07/27/12 1215  BP: 116/73  Pulse: 74  Temp: 36.8 C  Resp: 18    Post vital signs: Reviewed  Level of consciousness: sedated  Complications: No apparent anesthesia complications

## 2012-07-27 NOTE — Transfer of Care (Signed)
Immediate Anesthesia Transfer of Care Note  Patient: Pam Montgomery  Procedure(s) Performed: Procedure(s) with comments: ROBOTIC ASSISTED TOTAL HYSTERECTOMY (N/A) - with vaginal morcellation BILATERAL SALPINGECTOMY (Bilateral) CYSTOSCOPY (N/A)  Patient Location: PACU  Anesthesia Type:General  Level of Consciousness: awake, alert  and oriented  Airway & Oxygen Therapy: Patient Spontanous Breathing and Patient connected to nasal cannula oxygen  Post-op Assessment: Report given to PACU RN and Post -op Vital signs reviewed and stable  Post vital signs: stable  Complications: No apparent anesthesia complications

## 2012-07-27 NOTE — H&P (Signed)
Date of Initial H&P: 07/13/12  History reviewed, patient examined, no change in status, stable for surgery. 

## 2012-07-27 NOTE — Anesthesia Postprocedure Evaluation (Signed)
  Anesthesia Post-op Note  Patient: Pam Montgomery  Procedure(s) Performed: Procedure(s) with comments: ROBOTIC ASSISTED TOTAL HYSTERECTOMY (N/A) - with vaginal morcellation BILATERAL SALPINGECTOMY (Bilateral) CYSTOSCOPY (N/A)  Patient Location: PACU and Women's Unit  Anesthesia Type:General  Level of Consciousness: awake, alert , oriented and patient cooperative  Airway and Oxygen Therapy: Patient Spontanous Breathing  Post-op Pain: mild  Post-op Assessment: Post-op Vital signs reviewed, Patient's Cardiovascular Status Stable and Respiratory Function Stable  Post-op Vital Signs: Reviewed and stable  Complications: No apparent anesthesia complications

## 2012-07-28 ENCOUNTER — Encounter (HOSPITAL_COMMUNITY): Payer: Self-pay | Admitting: Obstetrics and Gynecology

## 2012-07-28 LAB — CBC
Hemoglobin: 9.7 g/dL — ABNORMAL LOW (ref 12.0–15.0)
Platelets: 207 10*3/uL (ref 150–400)
RBC: 3.02 MIL/uL — ABNORMAL LOW (ref 3.87–5.11)
WBC: 7.2 10*3/uL (ref 4.0–10.5)

## 2012-07-28 MED ORDER — IBUPROFEN 800 MG PO TABS: 800.0000 mg | ORAL_TABLET | Freq: Three times a day (TID) | ORAL | Status: AC | PRN

## 2012-07-28 MED ORDER — OXYCODONE-ACETAMINOPHEN 5-325 MG PO TABS: 1.0000 | ORAL_TABLET | Freq: Four times a day (QID) | ORAL | Status: AC | PRN

## 2012-07-28 NOTE — Discharge Summary (Signed)
Physician Discharge Summary  Patient ID: Pam Montgomery MRN: [id number] DOB/AGE: 06/27/1960 52 y.o.  Admit date: 07/27/2012 Discharge date: 07/28/2012  Admission Diagnoses: Uterine Fibroids   Discharge Diagnoses: s/p hysterectomy/bso cystoscopy  Active Problems:   * No active hospital problems. *   Discharged Condition: good  Hospital Course: Pt was placed in observation over night after robotic assisted laparoscopic hysterectomy / bso and cystoscopy   Consults: None  Significant Diagnostic Studies: labs: hgb pod #1 9.7  Treatments: surgery: robotic assisted laparoscopic hysterectomy / bso and cystoscopy    Discharge Exam: Blood pressure 100/69, pulse 71, temperature 98.2 F (36.8 C), temperature source Oral, resp. rate 18, height 5\' 6"  (1.676 m), weight 57.607 kg (127 lb), SpO2 100.00%. General appearance: alert and cooperative GI: normal findings: soft appropriately tender + BS  Extremities: extremities normal, atraumatic, no cyanosis or edema  Disposition:   Discharge Orders   Future Appointments Provider Department Dept Phone   08/16/2012 4:40 PM [facility] [phone]   Patient should wear two piece clothing and wear no powder or deodorant. Patient should arrive 15 minutes early.   Future Orders Complete By Expires     Call MD for:  persistant nausea and vomiting  As directed     Call MD for:  redness, tenderness, or signs of infection (pain, swelling, redness, odor or green/yellow discharge around incision site)  As directed     Call MD for:  severe uncontrolled pain  As directed     Call MD for:  temperature >100.4  As directed     Diet general  As directed     Driving Restrictions  As directed     Comments:      No driving for 1 week    Increase activity slowly  As directed     Lifting restrictions  As directed     Comments:      Do not lift over 10 lbs    Sexual Activity Restrictions  As directed     Comments:      No  sex until advised by Dr. Richardson Dopp        Medication List    STOP taking these medications       ZENCHENT 0.4-35 MG-MCG tablet  Generic drug:  norethindrone-ethinyl estradiol      TAKE these medications       ALPRAZolam 1 MG tablet  Commonly known as:  XANAX  Take 0.5 mg by mouth as needed for anxiety.     ibuprofen 800 MG tablet  Commonly known as:  ADVIL  Take 1 tablet (800 mg total) by mouth every 8 (eight) hours as needed for pain.     levothyroxine 175 MCG tablet  Commonly known as:  SYNTHROID, LEVOTHROID  Take 175 mcg by mouth daily.     multivitamin tablet  Take 1 tablet by mouth daily.     oxyCODONE-acetaminophen 5-325 MG per tablet  Commonly known as:  PERCOCET/ROXICET  Take 1-2 tablets by mouth every 6 (six) hours as needed.     TUMS ULTRA 1000 400 MG tablet  Generic drug:  calcium elemental as carbonate  Chew 2,000 mg by mouth 2 (two) times daily.     VITAMIN D-3 PO  Take 500 Units by mouth daily.           Follow-up Information   Follow up with Jessee Avers., MD In 2 weeks.   Contact information:   [address]  [address] [place] [address] [phone]       Signed: Jessee Avers. 07/28/2012, 7:42 AM

## 2012-07-28 NOTE — Progress Notes (Signed)
Discharge instructions reviewed with patient.  Patient states understanding of home care, medications, activity, signs/symptoms to report to MD and return MD office visit.  No home equipment needed.  Patient ambulated for discharge in stable condition with staff without incident.  

## 2012-08-16 ENCOUNTER — Ambulatory Visit: Payer: Self-pay

## 2012-08-16 ENCOUNTER — Ambulatory Visit: Payer: Managed Care, Other (non HMO)

## 2012-08-30 ENCOUNTER — Ambulatory Visit
Admission: RE | Admit: 2012-08-30 | Discharge: 2012-08-30 | Disposition: A | Discharge status: Home / Self Care | Payer: BC Managed Care – PPO | Source: Ambulatory Visit

## 2012-08-30 DIAGNOSIS — Z1231 Encounter for screening mammogram for malignant neoplasm of breast: Secondary | ICD-10-CM

## 2012-10-28 ENCOUNTER — Other Ambulatory Visit: Payer: Self-pay | Admitting: Internal Medicine

## 2012-10-28 DIAGNOSIS — C73 Malignant neoplasm of thyroid gland: Secondary | ICD-10-CM

## 2012-10-29 ENCOUNTER — Ambulatory Visit
Admission: RE | Admit: 2012-10-29 | Discharge: 2012-10-29 | Disposition: A | Discharge status: Home / Self Care | Payer: BC Managed Care – PPO | Source: Ambulatory Visit | Attending: Internal Medicine | Admitting: Internal Medicine

## 2012-10-29 DIAGNOSIS — C73 Malignant neoplasm of thyroid gland: Secondary | ICD-10-CM

## 2012-11-04 ENCOUNTER — Other Ambulatory Visit: Payer: Self-pay | Admitting: Internal Medicine

## 2012-11-04 DIAGNOSIS — E041 Nontoxic single thyroid nodule: Secondary | ICD-10-CM

## 2012-11-08 ENCOUNTER — Other Ambulatory Visit: Payer: Self-pay | Admitting: Gastroenterology

## 2012-11-10 ENCOUNTER — Other Ambulatory Visit: Payer: Self-pay | Admitting: Internal Medicine

## 2012-11-10 ENCOUNTER — Ambulatory Visit
Admission: RE | Admit: 2012-11-10 | Discharge: 2012-11-10 | Disposition: A | Discharge status: Home / Self Care | Payer: BC Managed Care – PPO | Source: Ambulatory Visit | Attending: Internal Medicine | Admitting: Internal Medicine

## 2012-11-10 DIAGNOSIS — E041 Nontoxic single thyroid nodule: Secondary | ICD-10-CM

## 2012-11-15 ENCOUNTER — Other Ambulatory Visit: Payer: Self-pay | Admitting: Dermatology

## 2012-12-16 ENCOUNTER — Other Ambulatory Visit: Payer: Self-pay | Admitting: Dermatology

## 2013-09-20 ENCOUNTER — Other Ambulatory Visit: Payer: Self-pay | Admitting: Internal Medicine

## 2013-09-20 DIAGNOSIS — C73 Malignant neoplasm of thyroid gland: Secondary | ICD-10-CM

## 2013-09-22 ENCOUNTER — Ambulatory Visit
Admission: RE | Admit: 2013-09-22 | Discharge: 2013-09-22 | Disposition: A | Discharge status: Home / Self Care | Payer: BC Managed Care – PPO | Source: Ambulatory Visit | Attending: Internal Medicine | Admitting: Internal Medicine

## 2013-09-22 DIAGNOSIS — C73 Malignant neoplasm of thyroid gland: Secondary | ICD-10-CM

## 2013-10-26 ENCOUNTER — Other Ambulatory Visit: Payer: Self-pay

## 2013-10-26 DIAGNOSIS — Z1231 Encounter for screening mammogram for malignant neoplasm of breast: Secondary | ICD-10-CM

## 2013-11-08 ENCOUNTER — Ambulatory Visit
Admission: RE | Admit: 2013-11-08 | Discharge: 2013-11-08 | Disposition: A | Discharge status: Home / Self Care | Payer: BC Managed Care – PPO | Source: Ambulatory Visit

## 2013-11-08 ENCOUNTER — Encounter (INDEPENDENT_AMBULATORY_CARE_PROVIDER_SITE_OTHER): Payer: Self-pay

## 2013-11-08 DIAGNOSIS — Z1231 Encounter for screening mammogram for malignant neoplasm of breast: Secondary | ICD-10-CM

## 2013-12-05 ENCOUNTER — Ambulatory Visit
Admission: RE | Admit: 2013-12-05 | Discharge: 2013-12-05 | Disposition: A | Discharge status: Home / Self Care | Payer: BC Managed Care – PPO | Source: Ambulatory Visit | Attending: Family Medicine | Admitting: Family Medicine

## 2013-12-05 ENCOUNTER — Other Ambulatory Visit: Payer: Self-pay | Admitting: Family Medicine

## 2013-12-05 DIAGNOSIS — R202 Paresthesia of skin: Secondary | ICD-10-CM

## 2014-10-18 ENCOUNTER — Other Ambulatory Visit: Payer: Self-pay

## 2014-10-18 DIAGNOSIS — Z1231 Encounter for screening mammogram for malignant neoplasm of breast: Secondary | ICD-10-CM

## 2014-12-05 ENCOUNTER — Ambulatory Visit
Admission: RE | Admit: 2014-12-05 | Discharge: 2014-12-05 | Disposition: A | Discharge status: Home / Self Care | Payer: BLUE CROSS/BLUE SHIELD | Source: Ambulatory Visit

## 2014-12-05 DIAGNOSIS — Z1231 Encounter for screening mammogram for malignant neoplasm of breast: Secondary | ICD-10-CM

## 2015-11-06 ENCOUNTER — Other Ambulatory Visit: Payer: Self-pay | Admitting: Family Medicine

## 2015-11-06 DIAGNOSIS — Z1231 Encounter for screening mammogram for malignant neoplasm of breast: Secondary | ICD-10-CM

## 2015-12-18 ENCOUNTER — Ambulatory Visit
Admission: RE | Admit: 2015-12-18 | Discharge: 2015-12-18 | Disposition: A | Discharge status: Home / Self Care | Payer: No Typology Code available for payment source | Source: Ambulatory Visit | Attending: Family Medicine | Admitting: Family Medicine

## 2015-12-18 DIAGNOSIS — Z1231 Encounter for screening mammogram for malignant neoplasm of breast: Secondary | ICD-10-CM

## 2016-08-20 ENCOUNTER — Ambulatory Visit
Admission: RE | Admit: 2016-08-20 | Discharge: 2016-08-20 | Disposition: A | Discharge status: Home / Self Care | Payer: No Typology Code available for payment source | Source: Ambulatory Visit | Attending: Family Medicine | Admitting: Family Medicine

## 2016-08-20 ENCOUNTER — Other Ambulatory Visit: Payer: Self-pay | Admitting: Family Medicine

## 2016-08-20 DIAGNOSIS — R9389 Abnormal findings on diagnostic imaging of other specified body structures: Secondary | ICD-10-CM

## 2016-11-20 ENCOUNTER — Other Ambulatory Visit: Payer: Self-pay | Admitting: Family Medicine

## 2016-11-20 DIAGNOSIS — Z1231 Encounter for screening mammogram for malignant neoplasm of breast: Secondary | ICD-10-CM

## 2016-12-23 ENCOUNTER — Ambulatory Visit
Admission: RE | Admit: 2016-12-23 | Discharge: 2016-12-23 | Disposition: A | Discharge status: Home / Self Care | Payer: No Typology Code available for payment source | Source: Ambulatory Visit | Attending: Family Medicine | Admitting: Family Medicine

## 2016-12-23 DIAGNOSIS — Z1231 Encounter for screening mammogram for malignant neoplasm of breast: Secondary | ICD-10-CM

## 2018-02-12 ENCOUNTER — Other Ambulatory Visit: Payer: Self-pay | Admitting: Family Medicine

## 2018-02-12 DIAGNOSIS — Z1231 Encounter for screening mammogram for malignant neoplasm of breast: Secondary | ICD-10-CM

## 2018-03-23 ENCOUNTER — Ambulatory Visit
Admission: RE | Admit: 2018-03-23 | Discharge: 2018-03-23 | Disposition: A | Discharge status: Home / Self Care | Payer: No Typology Code available for payment source | Source: Ambulatory Visit | Attending: Family Medicine | Admitting: Family Medicine

## 2018-03-23 DIAGNOSIS — Z1231 Encounter for screening mammogram for malignant neoplasm of breast: Secondary | ICD-10-CM

## 2019-02-07 ENCOUNTER — Other Ambulatory Visit: Payer: Self-pay | Admitting: Surgery

## 2019-03-07 ENCOUNTER — Other Ambulatory Visit: Payer: Self-pay | Admitting: Family Medicine

## 2019-03-07 DIAGNOSIS — Z1231 Encounter for screening mammogram for malignant neoplasm of breast: Secondary | ICD-10-CM

## 2019-04-12 ENCOUNTER — Ambulatory Visit: Payer: No Typology Code available for payment source

## 2019-05-18 ENCOUNTER — Ambulatory Visit: Payer: No Typology Code available for payment source

## 2019-06-14 ENCOUNTER — Other Ambulatory Visit: Payer: Self-pay

## 2019-06-14 ENCOUNTER — Ambulatory Visit
Admission: RE | Admit: 2019-06-14 | Discharge: 2019-06-14 | Disposition: A | Discharge status: Home / Self Care | Payer: No Typology Code available for payment source | Source: Ambulatory Visit | Attending: Family Medicine | Admitting: Family Medicine

## 2019-06-14 DIAGNOSIS — Z1231 Encounter for screening mammogram for malignant neoplasm of breast: Secondary | ICD-10-CM

## 2019-12-07 ENCOUNTER — Other Ambulatory Visit (HOSPITAL_COMMUNITY): Payer: Self-pay | Admitting: Internal Medicine

## 2019-12-07 ENCOUNTER — Ambulatory Visit: Payer: No Typology Code available for payment source | Attending: Internal Medicine

## 2019-12-07 DIAGNOSIS — Z23 Encounter for immunization: Secondary | ICD-10-CM

## 2019-12-07 NOTE — Progress Notes (Signed)
   Covid-19 Vaccination Clinic  Name:  Pam Montgomery    MRN: [id number] DOB: [birth year]  12/07/2019  Pam Montgomery was observed post Covid-19 immunization for 15 minutes without incident. She was provided with Vaccine Information Sheet and instruction to access the V-Safe system.   Pam Montgomery was instructed to call [phone] with any severe reactions post vaccine: Marland Kitchen Difficulty breathing  . Swelling of face and throat  . A fast heartbeat  . A bad rash all over body  . Dizziness and weakness

## 2020-06-29 ENCOUNTER — Other Ambulatory Visit: Payer: Self-pay | Admitting: Family Medicine

## 2020-06-29 DIAGNOSIS — Z1231 Encounter for screening mammogram for malignant neoplasm of breast: Secondary | ICD-10-CM

## 2020-08-08 ENCOUNTER — Other Ambulatory Visit (HOSPITAL_COMMUNITY): Payer: Self-pay

## 2020-08-23 ENCOUNTER — Other Ambulatory Visit: Payer: Self-pay

## 2020-08-23 ENCOUNTER — Ambulatory Visit
Admission: RE | Admit: 2020-08-23 | Discharge: 2020-08-23 | Disposition: A | Discharge status: Home / Self Care | Payer: No Typology Code available for payment source | Source: Ambulatory Visit

## 2020-08-23 DIAGNOSIS — Z1231 Encounter for screening mammogram for malignant neoplasm of breast: Secondary | ICD-10-CM

## 2021-08-11 IMAGING — MG DIGITAL SCREENING BILAT W/ TOMO W/ CAD
8 series · 8 of 24 positions shown · non-contrast
Comparison: Previous exam(s).

CLINICAL DATA: Screening.

EXAM:
DIGITAL SCREENING BILATERAL MAMMOGRAM WITH TOMO AND CAD

[R MLO synth-2D]
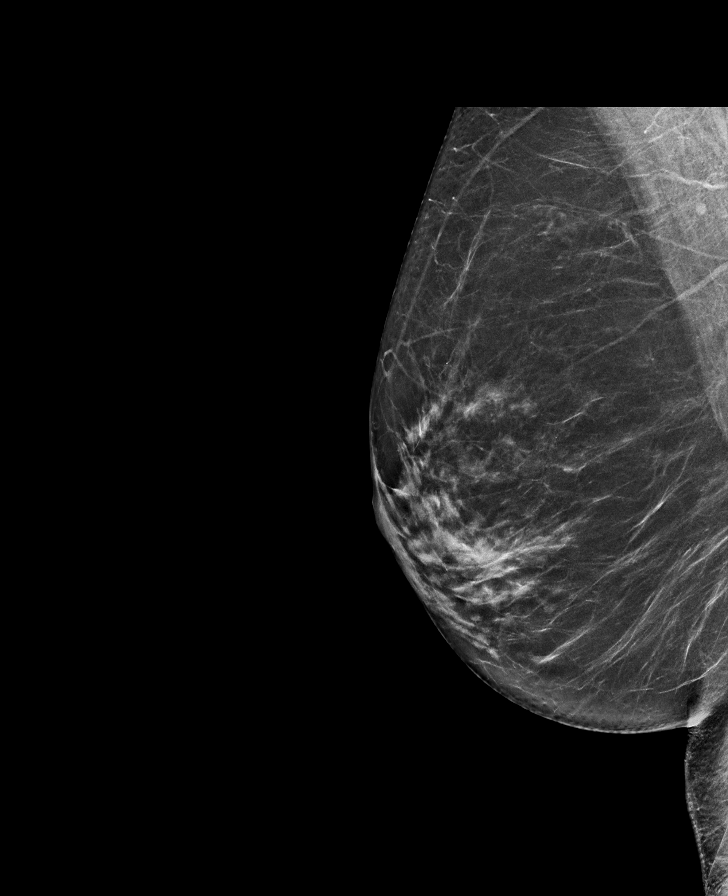

[R CC synth-2D]
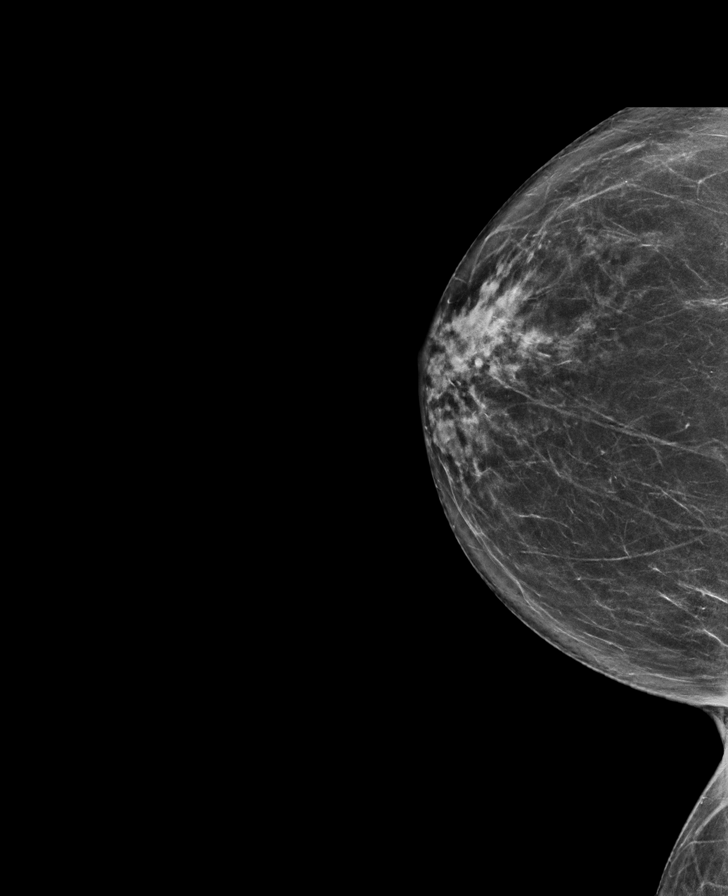

[L CC synth-2D]
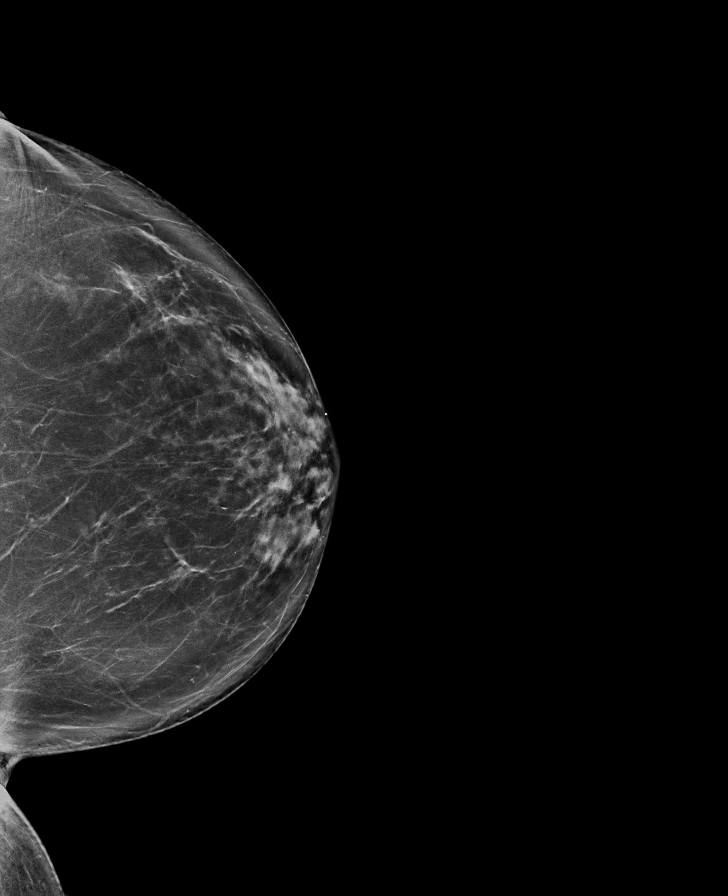

[L MLO synth-2D]
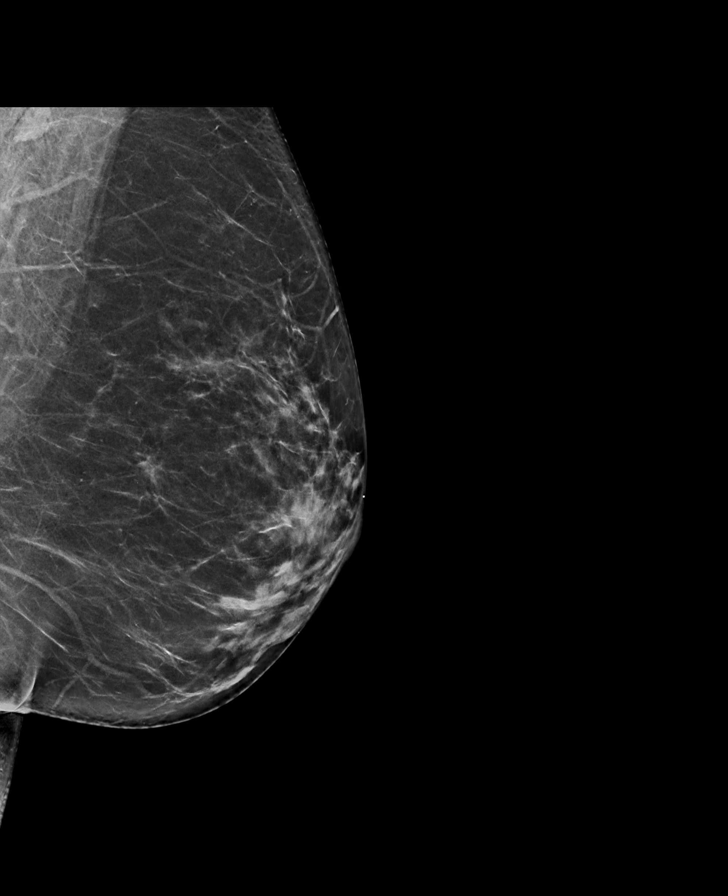

[L CC tomo · tomo slice 41/81.0]
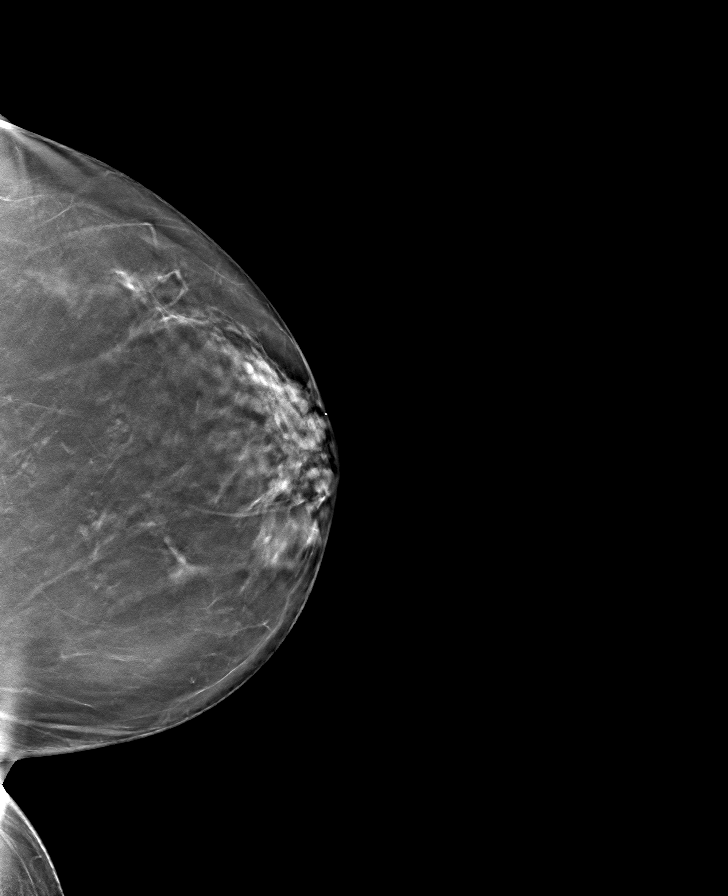

[L MLO tomo · tomo slice 39/77.0]
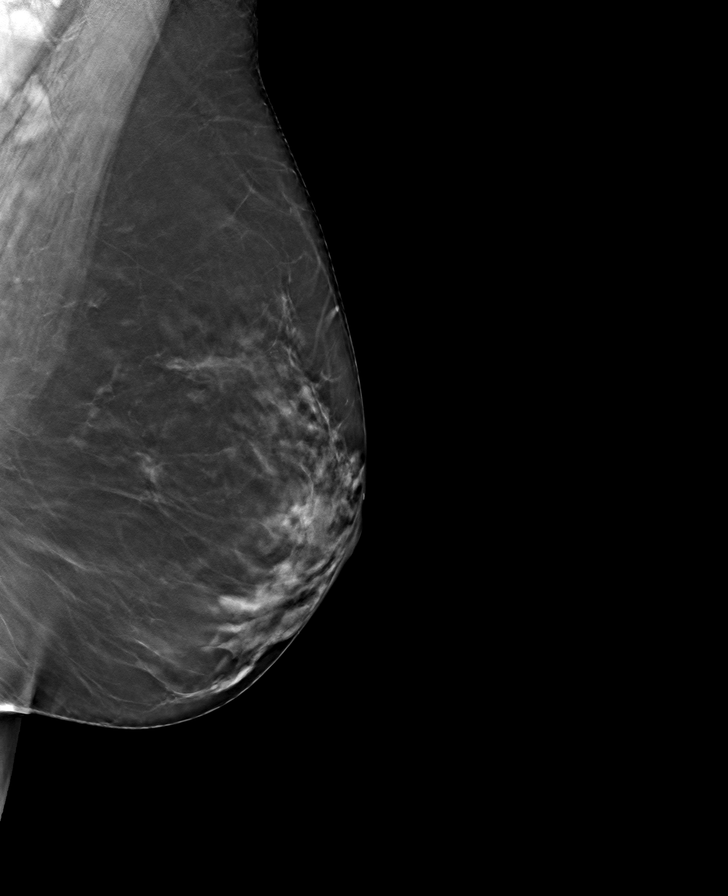

[R CC tomo · tomo slice 35/70.0]
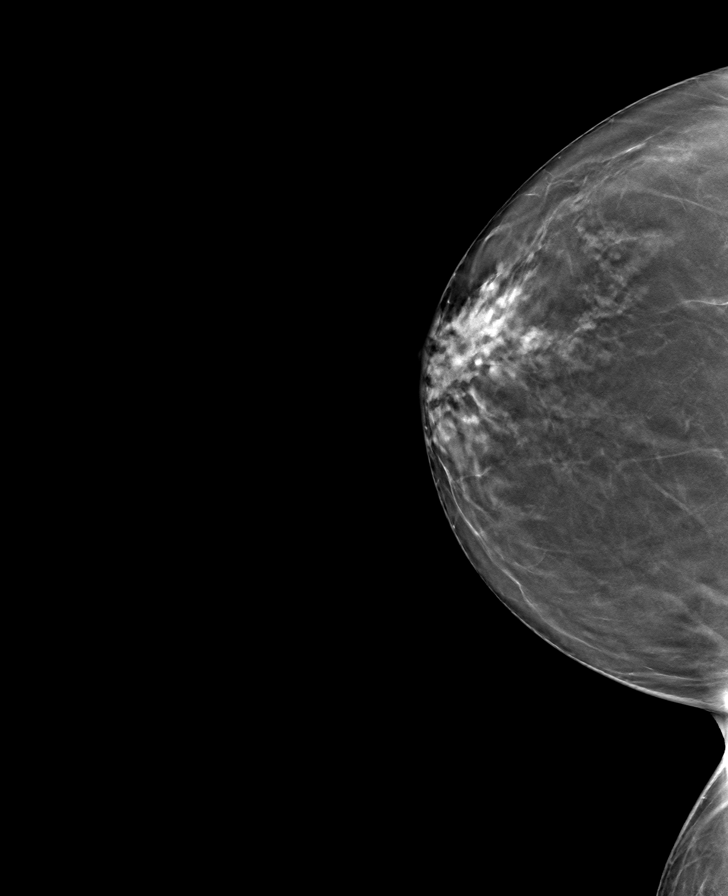

[R MLO tomo · tomo slice 37/73.0]
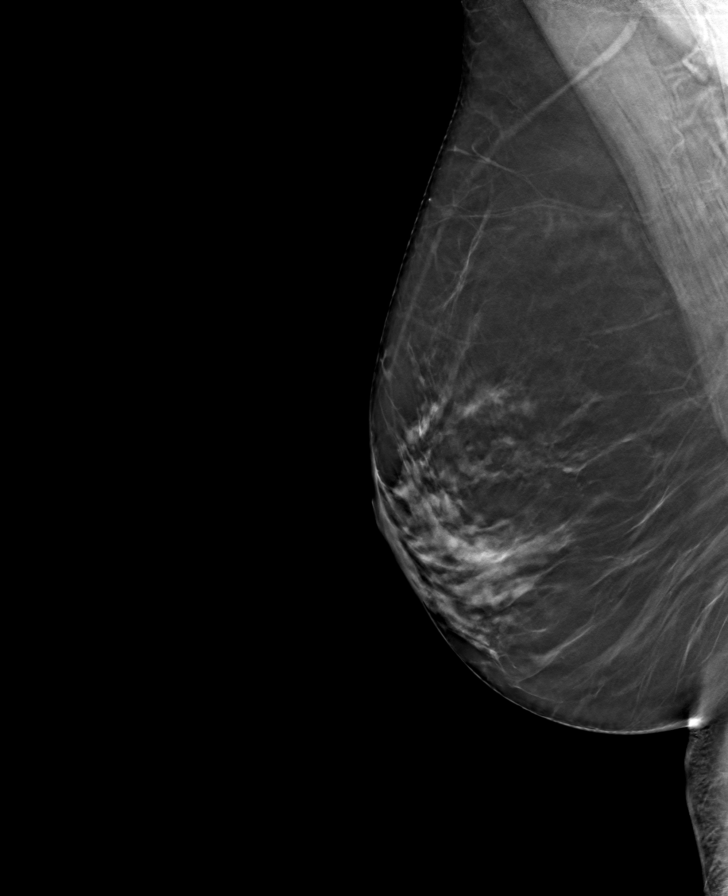

[8 of 24 positions shown; findings below may reference images not displayed]

ACR Breast Density Category c: The breast tissue is heterogeneously
dense, which may obscure small masses.
FINDINGS: There are no findings suspicious for malignancy. Images were
processed with CAD.
IMPRESSION: No mammographic evidence of malignancy. A result letter of this
screening mammogram will be mailed directly to the patient.

RECOMMENDATION:
Screening mammogram in one year. (Code:FT-U-LHB)

BI-RADS CATEGORY  1: Negative.

## 2021-09-23 ENCOUNTER — Other Ambulatory Visit: Payer: Self-pay | Admitting: Family Medicine

## 2021-09-23 DIAGNOSIS — Z1231 Encounter for screening mammogram for malignant neoplasm of breast: Secondary | ICD-10-CM

## 2021-10-02 ENCOUNTER — Ambulatory Visit
Admission: RE | Admit: 2021-10-02 | Discharge: 2021-10-02 | Disposition: A | Discharge status: Home / Self Care | Payer: No Typology Code available for payment source | Source: Ambulatory Visit | Attending: Family Medicine | Admitting: Family Medicine

## 2021-10-02 DIAGNOSIS — Z1231 Encounter for screening mammogram for malignant neoplasm of breast: Secondary | ICD-10-CM

## 2021-10-23 ENCOUNTER — Other Ambulatory Visit: Payer: Self-pay | Admitting: Family Medicine

## 2021-10-23 DIAGNOSIS — E782 Mixed hyperlipidemia: Secondary | ICD-10-CM

## 2021-11-04 ENCOUNTER — Ambulatory Visit
Admission: RE | Admit: 2021-11-04 | Discharge: 2021-11-04 | Disposition: A | Discharge status: Home / Self Care | Payer: No Typology Code available for payment source | Source: Ambulatory Visit | Attending: Family Medicine | Admitting: Family Medicine

## 2021-11-04 DIAGNOSIS — E782 Mixed hyperlipidemia: Secondary | ICD-10-CM

## 2021-11-06 ENCOUNTER — Other Ambulatory Visit: Payer: Self-pay | Admitting: Family Medicine

## 2021-11-06 DIAGNOSIS — K7689 Other specified diseases of liver: Secondary | ICD-10-CM

## 2021-11-20 ENCOUNTER — Ambulatory Visit
Admission: RE | Admit: 2021-11-20 | Discharge: 2021-11-20 | Disposition: A | Discharge status: Home / Self Care | Payer: No Typology Code available for payment source | Source: Ambulatory Visit | Attending: Family Medicine | Admitting: Family Medicine

## 2021-11-20 DIAGNOSIS — K7689 Other specified diseases of liver: Secondary | ICD-10-CM

## 2022-11-04 ENCOUNTER — Other Ambulatory Visit: Payer: Self-pay | Admitting: Family Medicine

## 2022-11-04 DIAGNOSIS — Z1231 Encounter for screening mammogram for malignant neoplasm of breast: Secondary | ICD-10-CM

## 2022-11-25 ENCOUNTER — Ambulatory Visit
Admission: RE | Admit: 2022-11-25 | Discharge: 2022-11-25 | Disposition: A | Discharge status: Home / Self Care | Payer: No Typology Code available for payment source | Source: Ambulatory Visit | Attending: Family Medicine | Admitting: Family Medicine

## 2022-11-25 DIAGNOSIS — Z1231 Encounter for screening mammogram for malignant neoplasm of breast: Secondary | ICD-10-CM

## 2022-12-26 ENCOUNTER — Other Ambulatory Visit: Payer: Self-pay | Admitting: Medical Genetics

## 2022-12-26 DIAGNOSIS — Z006 Encounter for examination for normal comparison and control in clinical research program: Secondary | ICD-10-CM

## 2023-01-08 ENCOUNTER — Other Ambulatory Visit (HOSPITAL_COMMUNITY)
Admission: RE | Admit: 2023-01-08 | Discharge: 2023-01-08 | Disposition: A | Discharge status: Home / Self Care | Payer: Self-pay | Source: Ambulatory Visit | Attending: Oncology | Admitting: Oncology

## 2023-01-08 DIAGNOSIS — Z006 Encounter for examination for normal comparison and control in clinical research program: Secondary | ICD-10-CM | POA: Insufficient documentation

## 2023-01-20 LAB — GENECONNECT MOLECULAR SCREEN: Genetic Analysis Overall Interpretation: NEGATIVE

## 2024-01-28 ENCOUNTER — Other Ambulatory Visit: Payer: Self-pay

## 2024-01-28 MED ORDER — LEVOTHYROXINE SODIUM 125 MCG PO TABS
125.0000 ug | ORAL_TABLET | Freq: Every morning | ORAL | 3 refills | Status: AC
  Filled 2024-01-28: qty 90, 90d supply, fill #0

## 2024-01-28 MED ORDER — ALPRAZOLAM 1 MG PO TABS
0.5000 mg | ORAL_TABLET | Freq: Every day | ORAL | 0 refills | Status: AC | PRN
  Filled 2024-01-28: qty 90, 90d supply, fill #0

## 2024-01-29 ENCOUNTER — Other Ambulatory Visit: Payer: Self-pay

## 2024-02-02 ENCOUNTER — Other Ambulatory Visit: Payer: Self-pay

## 2024-02-02 MED ORDER — CYCLOBENZAPRINE HCL 5 MG PO TABS
5.0000 mg | ORAL_TABLET | Freq: Three times a day (TID) | ORAL | 0 refills | Status: AC | PRN
  Filled 2024-02-02: qty 30, 10d supply, fill #0

## 2024-03-03 ENCOUNTER — Other Ambulatory Visit: Payer: Self-pay | Admitting: Family Medicine

## 2024-03-03 DIAGNOSIS — Z1231 Encounter for screening mammogram for malignant neoplasm of breast: Secondary | ICD-10-CM

## 2024-03-18 ENCOUNTER — Ambulatory Visit

## 2024-03-24 ENCOUNTER — Ambulatory Visit
Admission: RE | Admit: 2024-03-24 | Discharge: 2024-03-24 | Disposition: A | Discharge status: Home / Self Care | Source: Ambulatory Visit | Attending: Family Medicine | Admitting: Family Medicine

## 2024-03-24 DIAGNOSIS — Z1231 Encounter for screening mammogram for malignant neoplasm of breast: Secondary | ICD-10-CM
